# Patient Record
Sex: Male | Born: 1995
Health system: Southern US, Community
[De-identification: ages and names within clinical notes are randomized; demographics above are authoritative.]

## PROBLEM LIST (undated history)

## (undated) DIAGNOSIS — S060X0A Concussion without loss of consciousness, initial encounter: Secondary | ICD-10-CM

## (undated) HISTORY — PX: APPENDECTOMY: SHX54

## (undated) HISTORY — PX: TONSILLECTOMY: SUR1361

## (undated) HISTORY — DX: Concussion without loss of consciousness, initial encounter: S06.0X0A

## (undated) HISTORY — PX: ADENOIDECTOMY: SUR15

---
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
## 2016-04-28 ENCOUNTER — Emergency Department (HOSPITAL_COMMUNITY)
Admission: EM | Admit: 2016-04-28 | Discharge: 2016-04-28 | Disposition: A | Discharge status: Home / Self Care | Payer: No Typology Code available for payment source | Attending: Emergency Medicine | Admitting: Emergency Medicine

## 2016-04-28 ENCOUNTER — Encounter (HOSPITAL_COMMUNITY): Payer: Self-pay

## 2016-04-28 ENCOUNTER — Emergency Department (HOSPITAL_COMMUNITY): Payer: No Typology Code available for payment source

## 2016-04-28 DIAGNOSIS — Y9384 Activity, sleeping: Secondary | ICD-10-CM | POA: Insufficient documentation

## 2016-04-28 DIAGNOSIS — S60414A Abrasion of right ring finger, initial encounter: Secondary | ICD-10-CM | POA: Diagnosis not present

## 2016-04-28 DIAGNOSIS — Y999 Unspecified external cause status: Secondary | ICD-10-CM | POA: Diagnosis not present

## 2016-04-28 DIAGNOSIS — M545 Low back pain, unspecified: Secondary | ICD-10-CM

## 2016-04-28 DIAGNOSIS — S6991XA Unspecified injury of right wrist, hand and finger(s), initial encounter: Secondary | ICD-10-CM | POA: Diagnosis present

## 2016-04-28 DIAGNOSIS — Y92410 Unspecified street and highway as the place of occurrence of the external cause: Secondary | ICD-10-CM | POA: Diagnosis not present

## 2016-04-28 MED ORDER — METHOCARBAMOL 500 MG PO TABS: 500.0000 mg | ORAL_TABLET | Freq: Two times a day (BID) | ORAL | 0 refills | Status: DC | PRN

## 2016-04-28 MED ORDER — NAPROXEN 250 MG PO TABS: 250.0000 mg | ORAL_TABLET | Freq: Two times a day (BID) | ORAL | 0 refills | Status: DC

## 2016-04-28 NOTE — ED Notes (Signed)
Patient transported to X-ray 
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
## 2016-10-22 ENCOUNTER — Encounter: Payer: Self-pay | Admitting: Family Medicine

## 2016-10-22 ENCOUNTER — Ambulatory Visit (INDEPENDENT_AMBULATORY_CARE_PROVIDER_SITE_OTHER): Payer: BLUE CROSS/BLUE SHIELD | Admitting: Family Medicine

## 2016-10-22 VITALS — BP 130/90 | HR 78 | Ht 71.25 in | Wt 187.0 lb

## 2016-10-22 DIAGNOSIS — Z113 Encounter for screening for infections with a predominantly sexual mode of transmission: Secondary | ICD-10-CM | POA: Diagnosis not present

## 2016-10-22 DIAGNOSIS — Z0000 Encounter for general adult medical examination without abnormal findings: Secondary | ICD-10-CM | POA: Diagnosis not present

## 2016-10-22 LAB — COMPREHENSIVE METABOLIC PANEL
ALBUMIN: 4.7 g/dL (ref 3.5–5.2)
ALK PHOS: 58 U/L (ref 39–117)
ALT: 12 U/L (ref 0–53)
AST: 20 U/L (ref 0–37)
BILIRUBIN TOTAL: 0.9 mg/dL (ref 0.2–1.2)
BUN: 17 mg/dL (ref 6–23)
CALCIUM: 9.8 mg/dL (ref 8.4–10.5)
CHLORIDE: 105 meq/L (ref 96–112)
CO2: 30 mEq/L (ref 19–32)
CREATININE: 1.03 mg/dL (ref 0.40–1.50)
GFR: 117.83 mL/min (ref >60.00)
Glucose, Bld: 92 mg/dL (ref 70–99)
Potassium: 3.9 mEq/L (ref 3.5–5.1)
SODIUM: 139 meq/L (ref 135–145)
TOTAL PROTEIN: 7.2 g/dL (ref 6.0–8.3)

## 2016-10-22 LAB — LIPID PANEL
CHOLESTEROL: 110 mg/dL (ref 0–200)
HDL: 36.2 mg/dL — ABNORMAL LOW (ref >39.00)
LDL CALC: 67 mg/dL (ref 0–99)
NonHDL: 73.91
TRIGLYCERIDES: 34 mg/dL (ref 0.0–149.0)
Total CHOL/HDL Ratio: 3
VLDL: 6.8 mg/dL (ref 0.0–40.0)
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
## 2016-10-22 NOTE — Progress Notes (Signed)
Pre visit review using our clinic review tool, if applicable. No additional management support is needed unless otherwise documented below in the visit note. 

## 2016-10-22 NOTE — Assessment & Plan Note (Signed)
Discussed dangers of smoking, alcohol, and drug abuse.  Also discussed sexual activity, pregnancy risk, and STD risk.  Encouraged to get regular exercise and a balanced diet.  Discussed immunizations and they have also been updated in the chart.  Orders Placed This Encounter  Procedures  . GC/Chlamydia Probe Amp  . Comprehensive metabolic panel  . Lipid panel  . HIV antibody (with reflex)  . RPR  . HSV(herpes simplex vrs) 1+2 ab-IgM

## 2016-10-23 LAB — GC/CHLAMYDIA PROBE AMP
CT PROBE, AMP APTIMA: NOT DETECTED
GC PROBE AMP APTIMA: NOT DETECTED

## 2016-10-23 LAB — RPR

## 2016-10-23 LAB — HIV ANTIBODY (ROUTINE TESTING W REFLEX): HIV: NONREACTIVE

## 2016-10-29 LAB — HSV 1/2 AB (IGM), IFA W/RFLX TITER
HSV 1 IgM Screen: NEGATIVE
HSV 2 IgM Screen: NEGATIVE

## 2017-01-14 ENCOUNTER — Telehealth: Payer: Self-pay | Admitting: Family Medicine

## 2017-01-14 ENCOUNTER — Ambulatory Visit: Payer: BLUE CROSS/BLUE SHIELD | Admitting: Family Medicine

## 2017-01-14 NOTE — Telephone Encounter (Signed)
Patient did not come in for their appointment today for personal. Please let me know if patient needs to be contacted immediately for follow up or no follow up needed. Do you want to charge the NSF?

## 2017-01-14 NOTE — Telephone Encounter (Signed)
Yes please contact patient to see why he missed appointment and if he still needs to be seen- please don't charge if there was an emergency.

## 2017-08-10 ENCOUNTER — Other Ambulatory Visit: Payer: Self-pay

## 2017-08-10 ENCOUNTER — Emergency Department
Admission: EM | Admit: 2017-08-10 | Discharge: 2017-08-10 | Disposition: A | Discharge status: Home / Self Care | Payer: BLUE CROSS/BLUE SHIELD | Attending: Student in an Organized Health Care Education/Training Program | Admitting: Student in an Organized Health Care Education/Training Program

## 2017-08-10 ENCOUNTER — Emergency Department: Payer: BLUE CROSS/BLUE SHIELD

## 2017-08-10 DIAGNOSIS — W208XXA Other cause of strike by thrown, projected or falling object, initial encounter: Secondary | ICD-10-CM | POA: Diagnosis not present

## 2017-08-10 DIAGNOSIS — Y939 Activity, unspecified: Secondary | ICD-10-CM | POA: Diagnosis not present

## 2017-08-10 DIAGNOSIS — S90112A Contusion of left great toe without damage to nail, initial encounter: Secondary | ICD-10-CM | POA: Insufficient documentation

## 2017-08-10 DIAGNOSIS — Y999 Unspecified external cause status: Secondary | ICD-10-CM | POA: Insufficient documentation

## 2017-08-10 DIAGNOSIS — S9032XA Contusion of left foot, initial encounter: Secondary | ICD-10-CM | POA: Diagnosis not present

## 2017-08-10 DIAGNOSIS — M79672 Pain in left foot: Secondary | ICD-10-CM | POA: Diagnosis not present

## 2017-08-10 DIAGNOSIS — F17200 Nicotine dependence, unspecified, uncomplicated: Secondary | ICD-10-CM | POA: Diagnosis not present

## 2017-08-10 DIAGNOSIS — S90212A Contusion of left great toe with damage to nail, initial encounter: Secondary | ICD-10-CM | POA: Diagnosis not present

## 2017-08-10 DIAGNOSIS — Y929 Unspecified place or not applicable: Secondary | ICD-10-CM | POA: Insufficient documentation

## 2017-08-10 DIAGNOSIS — S99922A Unspecified injury of left foot, initial encounter: Secondary | ICD-10-CM | POA: Diagnosis not present

## 2017-08-10 MED ORDER — TRAMADOL HCL 50 MG PO TABS
50.0000 mg | ORAL_TABLET | Freq: Once | ORAL | Status: AC
  Administered 2017-08-10: 50 mg via ORAL
  Filled 2017-08-10: qty 1

## 2017-08-10 MED ORDER — TRAMADOL HCL 50 MG PO TABS: 50.0000 mg | ORAL_TABLET | Freq: Four times a day (QID) | ORAL | 0 refills | Status: DC | PRN

## 2017-08-10 MED ORDER — IBUPROFEN 600 MG PO TABS: 600.0000 mg | ORAL_TABLET | Freq: Three times a day (TID) | ORAL | 0 refills | Status: DC | PRN

## 2017-08-10 MED ORDER — NAPROXEN 500 MG PO TABS
500.0000 mg | ORAL_TABLET | Freq: Once | ORAL | Status: AC
  Administered 2017-08-10: 500 mg via ORAL
  Filled 2017-08-10: qty 1

## 2017-08-10 NOTE — ED Provider Notes (Signed)
[facility] Emergency Department Provider Note   ____________________________________________   First MD Initiated Contact with Patient 08/10/17 1800     (approximate)  I have reviewed the triage vital signs and the nursing notes.   HISTORY  Chief Complaint Foot Injury    HPI Todd Mcintyre is a 22 y.o. male patient complaining of left foot pain secondary to dropping a brick on his foot.  Patient presents with swelling and bruising to the left great toe.  Patient able to ambulate with mild discomfort.  Patient rates the pain as a 9/10.  Patient described the pain is "ache".  No pulses measured prior to arrival.  Patient denies loss of sensation to the foot.  Past Medical History:  Diagnosis Date  . Concussion without loss of consciousness 02/06/2013    Patient Active Problem List   Diagnosis Date Noted  . Visit for well man health check 10/22/2016  . Screening examination for STD (sexually transmitted disease) 10/22/2016    Past Surgical History:  Procedure Laterality Date  . ADENOIDECTOMY    . APPENDECTOMY    . LAPAROSCOPIC APPENDECTOMY N/A 06/11/2014   Procedure: APPENDECTOMY LAPAROSCOPIC;  Surgeon: Atilano InaEric M Wilson, MD;  [facility] OR;  Service: General;  Laterality: N/A;  . TONSILLECTOMY      Prior to Admission medications   Medication Sig Start Date End Date Taking? Authorizing Provider  ibuprofen (ADVIL,MOTRIN) 600 MG tablet Take 1 tablet (600 mg total) by mouth every 8 (eight) hours as needed. 08/10/17   Joni ReiningSmith, Ronald K, PA-C  methocarbamol (ROBAXIN) 500 MG tablet Take 1 tablet (500 mg total) by mouth 2 (two) times daily as needed for muscle spasms. Patient not taking: Reported on 10/22/2016 04/28/16   Everlene Farrieransie, William, PA-C  naproxen (NAPROSYN) 250 MG tablet Take 1 tablet (250 mg total) by mouth 2 (two) times daily with a meal. Patient not taking: Reported on 10/22/2016 04/28/16   Everlene Farrieransie, William, PA-C  oxyCODONE-acetaminophen  (PERCOCET/ROXICET) 5-325 MG per tablet Take 1-2 tablets by mouth every 4 (four) hours as needed for moderate pain. Patient not taking: Reported on 10/22/2016 06/12/14   Gaynelle AduWilson, Eric, MD  traMADol (ULTRAM) 50 MG tablet Take 1 tablet (50 mg total) by mouth every 6 (six) hours as needed for moderate pain. 08/10/17   Joni ReiningSmith, Ronald K, PA-C    Allergies Patient has no known allergies.  Family History  Problem Relation Age of Onset  . Diabetes Maternal Grandmother     Social History Social History   Tobacco Use  . Smoking status: Light Tobacco Smoker  . Smokeless tobacco: Never Used  Substance Use Topics  . Alcohol use: Yes  . Drug use: Yes    Types: Marijuana    Comment: pt states uses it "twice a  day everyday"    Review of Systems Constitutional: No fever/chills Eyes: No visual changes. ENT: No sore throat. Cardiovascular: Denies chest pain. Respiratory: Denies shortness of breath. Gastrointestinal: No abdominal pain.  No nausea, no vomiting.  No diarrhea.  No constipation. Genitourinary: Negative for dysuria. Musculoskeletal: Left foot pain  skin: Negative for rash. Neurological: Negative for headaches, focal weakness or numbness.   ____________________________________________   PHYSICAL EXAM:  VITAL SIGNS: ED Triage Vitals [08/10/17 1659]  Enc Vitals Group     BP 135/89     Pulse Rate 89     Resp 18     Temp 98.7 F (37.1 C)     Temp Source Oral     SpO2  99 %     Weight 187 lb (84.8 kg)     Height 6' (1.829 m)     Head Circumference      Peak Flow      Pain Score 9     Pain Loc      Pain Edu?      Excl. in GC?    Constitutional: Alert and oriented. Well appearing and in no acute distress. Cardiovascular: Normal rate, regular rhythm. Grossly normal heart sounds.  Good peripheral circulation. Respiratory: Normal respiratory effort.  No retractions. Lungs CTAB. Musculoskeletal: No obvious deformity to left great toe.  Mild edema with moderate guarding  palpation.  Patient has equal flexion extension of the left great toe. Skin:  Skin is warm, dry and intact. No rash noted.  ____________________________________________   LABS (all labs ordered are listed, but only abnormal results are displayed)  Labs Reviewed - No data to display ____________________________________________  EKG   ____________________________________________  RADIOLOGY  No acute findings x-ray of the left great toe.  Official radiology report(s): Dg Foot Complete Left  Result Date: 08/10/2017 CLINICAL DATA:  Status post trauma to the left foot with pain. EXAM: LEFT FOOT - COMPLETE 3+ VIEW COMPARISON:  None. FINDINGS: There is no evidence of fracture or dislocation. There is no evidence of arthropathy or other focal bone abnormality. Soft tissues are unremarkable. IMPRESSION: Negative. Electronically Signed   By: Sherian Rein M.D.   On: 08/10/2017 17:26    ____________________________________________   PROCEDURES  Procedure(s) performed: None  Procedures  Critical Care performed: No  ____________________________________________   INITIAL IMPRESSION / ASSESSMENT AND PLAN / ED COURSE  As part of my medical decision making, I reviewed the following data within the electronic MEDICAL RECORD NUMBER    Patient presents with left great toe pain secondary to contusion.  Discussed negative x-ray findings with patient.  Patient given discharge care instructions.  Patient given a work note and advised take medication as needed.  Patient advised to follow-up with PCP if no improvement in 3-5 days.      ____________________________________________   FINAL CLINICAL IMPRESSION(S) / ED DIAGNOSES  Final diagnoses:  Contusion of left great toe without damage to nail, initial encounter  Contusion of left foot, initial encounter     ED Discharge Orders        Ordered    ibuprofen (ADVIL,MOTRIN) 600 MG tablet  Every 8 hours PRN     08/10/17 1808    traMADol  (ULTRAM) 50 MG tablet  Every 6 hours PRN     08/10/17 1808       Note:  This document was prepared using Dragon voice recognition software and may include unintentional dictation errors.    Joni Reining, PA-C 08/10/17 1827    Willy Eddy, MD 08/10/17 Silva Bandy

## 2017-08-10 NOTE — Discharge Instructions (Signed)
Wear open shoe for 1-2 days as needed.  Follow discharge care instruction take medication as directed.

## 2017-08-10 NOTE — ED Triage Notes (Signed)
Pt arrives to ED (not workers comp). Dropped 22lb bricks on L foot. Swelling and bruising noted to L big toe. Bruising at nail bed. Pt states nail was already half a nail d/t basketball injury. Pt able to walk on foot. Alert, oriented.

## 2018-03-15 DIAGNOSIS — Z202 Contact with and (suspected) exposure to infections with a predominantly sexual mode of transmission: Secondary | ICD-10-CM | POA: Diagnosis not present

## 2018-10-08 IMAGING — DX DG FOOT COMPLETE 3+V*L*
3 series · 3 of 3 positions shown · non-contrast
Comparison: None.

CLINICAL DATA: Status post trauma to the left foot with pain.

EXAM:
LEFT FOOT - COMPLETE 3+ VIEW

[foot ap]
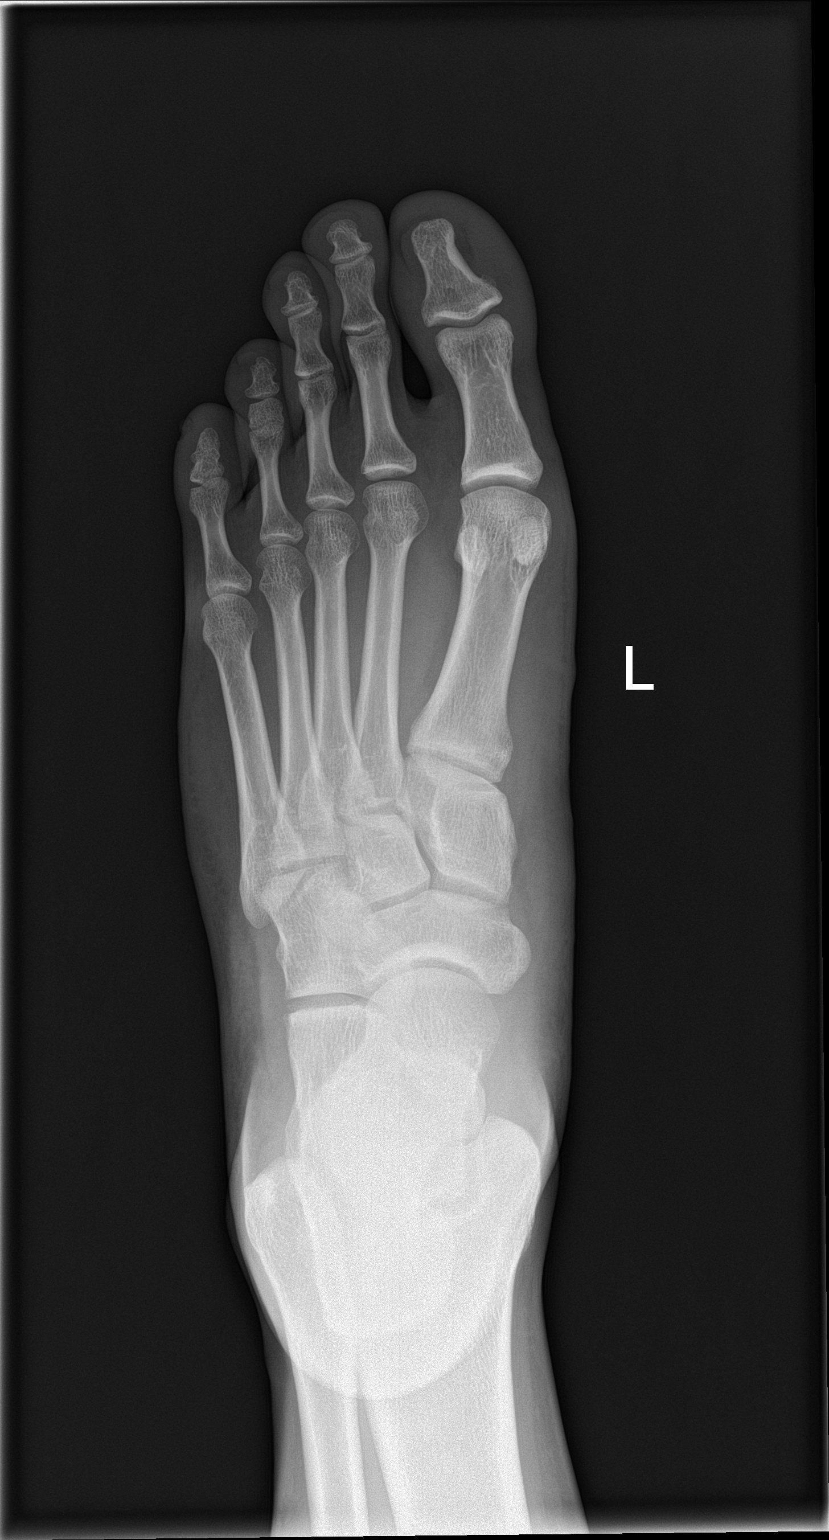

[foot obl]
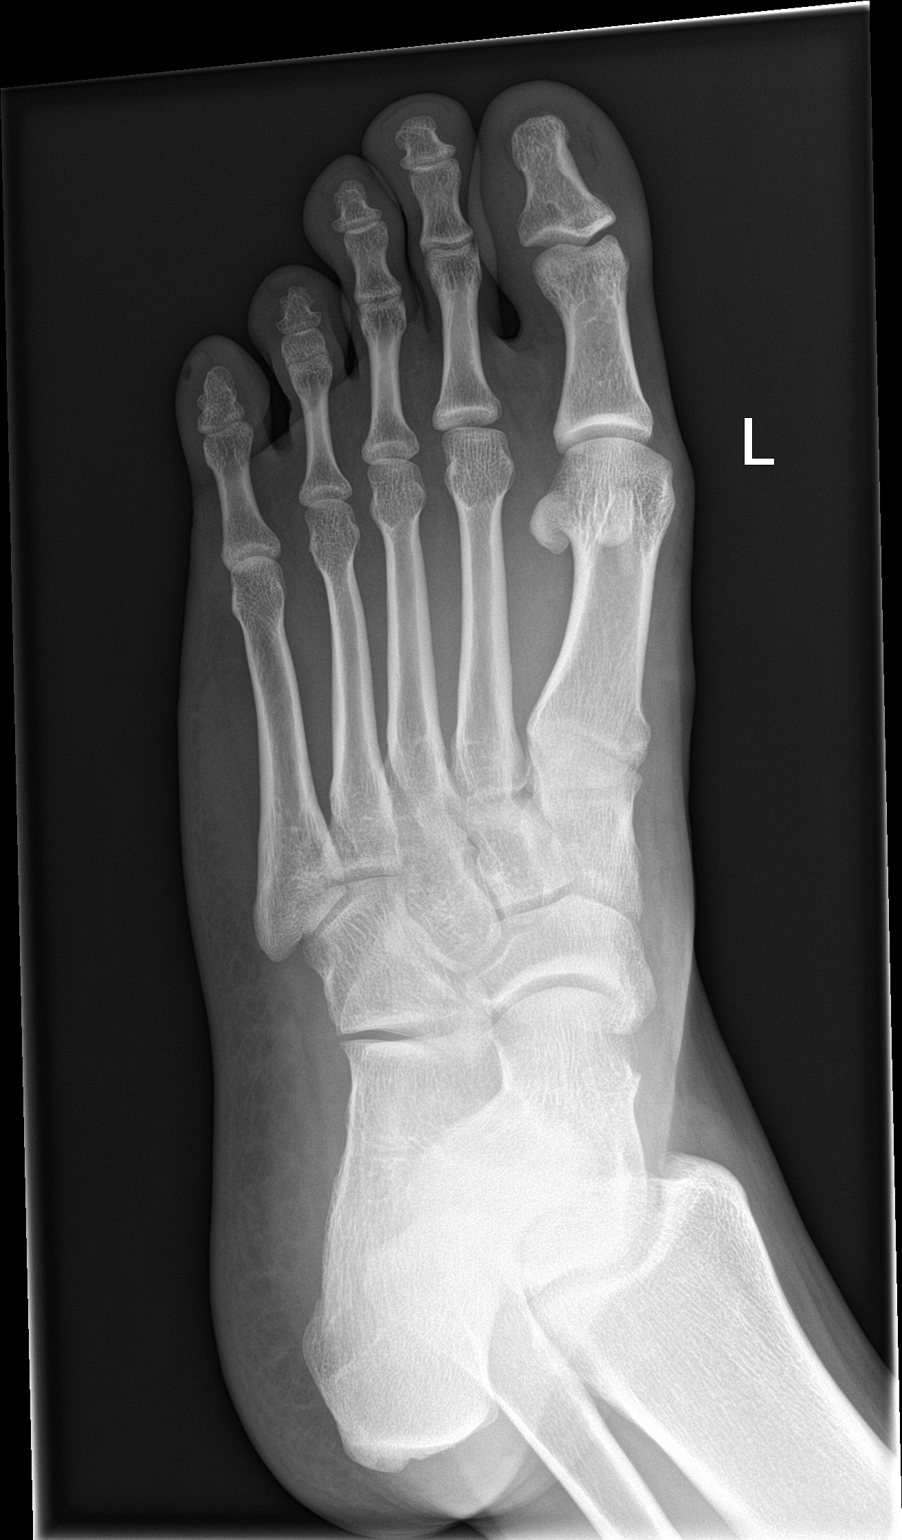

[foot lat]
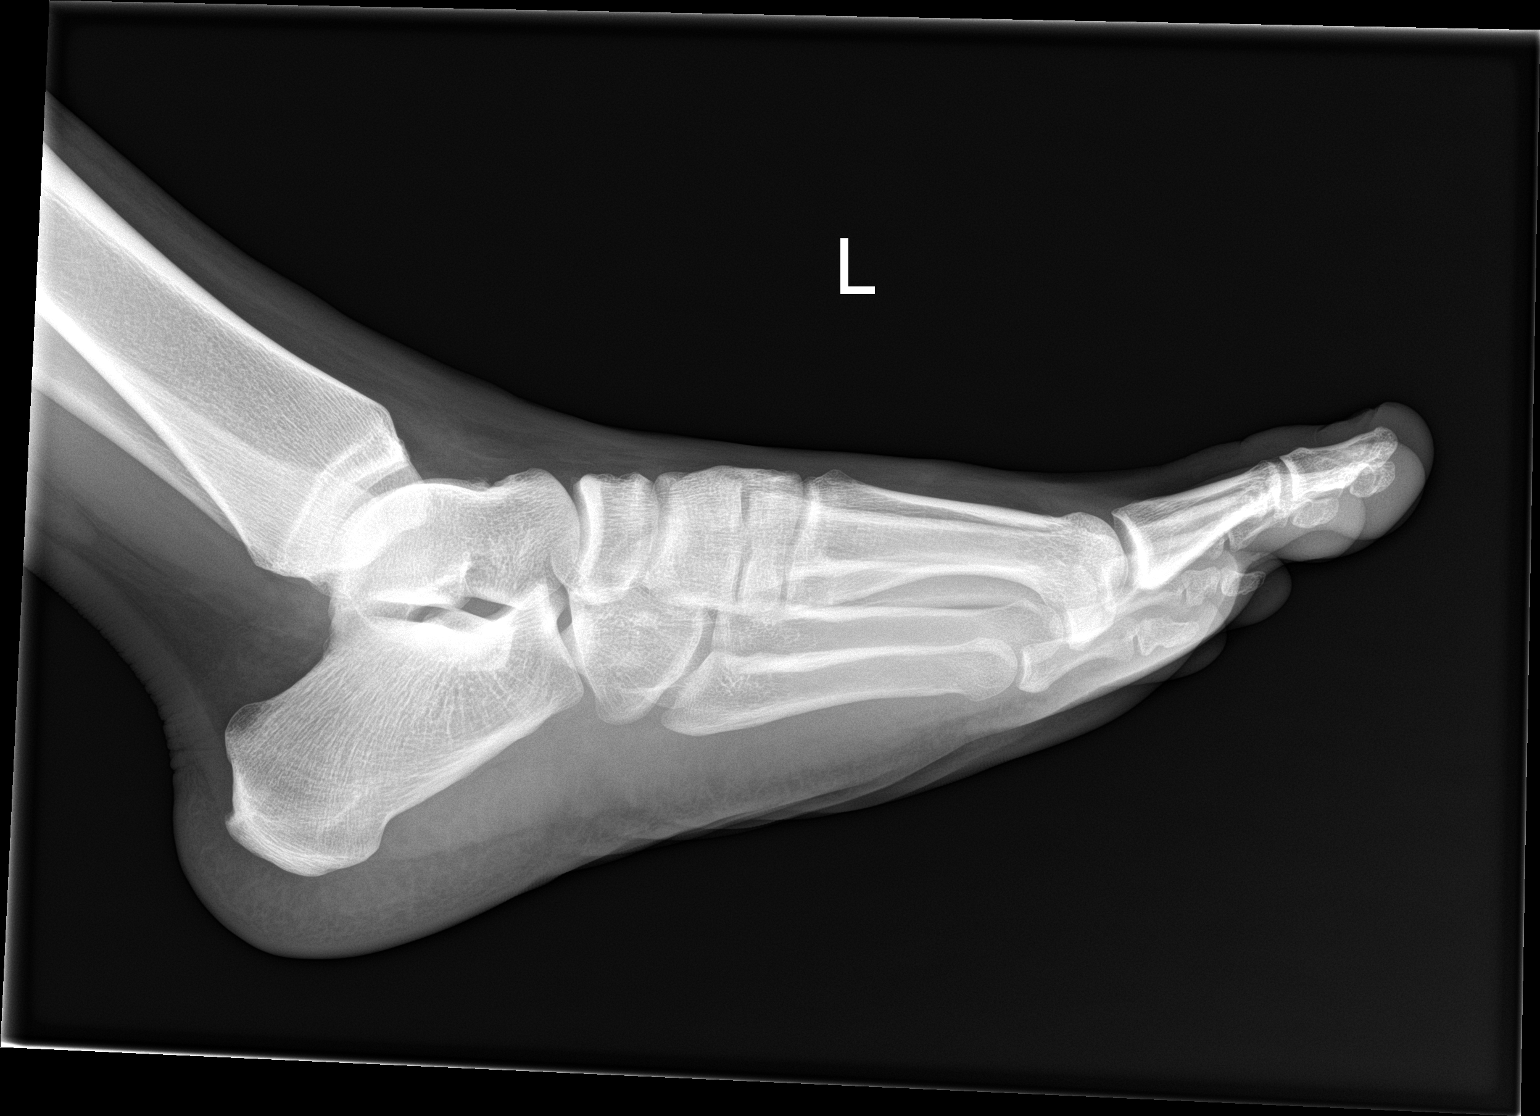

[3 of 3 positions shown; findings below may reference images not displayed]

FINDINGS: There is no evidence of fracture or dislocation. There is no
evidence of arthropathy or other focal bone abnormality. Soft
tissues are unremarkable.
IMPRESSION: Negative.

## 2019-05-15 ENCOUNTER — Other Ambulatory Visit: Payer: BLUE CROSS/BLUE SHIELD

## 2019-05-23 ENCOUNTER — Other Ambulatory Visit: Payer: BC Managed Care – PPO

## 2019-05-24 ENCOUNTER — Ambulatory Visit: Payer: BC Managed Care – PPO | Attending: Internal Medicine

## 2019-05-24 DIAGNOSIS — Z20822 Contact with and (suspected) exposure to covid-19: Secondary | ICD-10-CM

## 2019-05-25 LAB — NOVEL CORONAVIRUS, NAA: SARS-CoV-2, NAA: NOT DETECTED

## 2019-06-16 ENCOUNTER — Other Ambulatory Visit: Payer: BC Managed Care – PPO

## 2019-06-19 ENCOUNTER — Ambulatory Visit: Payer: BC Managed Care – PPO | Attending: Internal Medicine

## 2019-06-19 DIAGNOSIS — Z20822 Contact with and (suspected) exposure to covid-19: Secondary | ICD-10-CM

## 2019-06-20 LAB — NOVEL CORONAVIRUS, NAA: SARS-CoV-2, NAA: NOT DETECTED

## 2019-11-15 ENCOUNTER — Telehealth: Payer: Self-pay | Admitting: General Practice

## 2019-11-15 NOTE — Telephone Encounter (Signed)
Removed Dr. Dayton Martes as patient's PCP due to her leaving this facility and patient has not been seen in 3 years.

## 2019-12-04 ENCOUNTER — Ambulatory Visit: Payer: BC Managed Care – PPO | Attending: Internal Medicine

## 2019-12-04 ENCOUNTER — Other Ambulatory Visit: Payer: BC Managed Care – PPO

## 2019-12-04 DIAGNOSIS — Z20822 Contact with and (suspected) exposure to covid-19: Secondary | ICD-10-CM

## 2019-12-05 LAB — SARS-COV-2, NAA 2 DAY TAT

## 2019-12-05 LAB — NOVEL CORONAVIRUS, NAA: SARS-CoV-2, NAA: NOT DETECTED

## 2023-02-20 ENCOUNTER — Emergency Department (HOSPITAL_COMMUNITY)
Admission: EM | Admit: 2023-02-20 | Discharge: 2023-02-20 | Disposition: A | Discharge status: Home / Self Care | Payer: Medicaid Other | Attending: Emergency Medicine | Admitting: Emergency Medicine

## 2023-02-20 ENCOUNTER — Emergency Department (HOSPITAL_COMMUNITY): Payer: Medicaid Other

## 2023-02-20 ENCOUNTER — Other Ambulatory Visit: Payer: Self-pay

## 2023-02-20 DIAGNOSIS — Q0701 Arnold-Chiari syndrome with spina bifida: Secondary | ICD-10-CM | POA: Insufficient documentation

## 2023-02-20 DIAGNOSIS — H532 Diplopia: Secondary | ICD-10-CM | POA: Diagnosis present

## 2023-02-20 LAB — CBC
HCT: 46.6 % (ref 39.0–52.0)
Hemoglobin: 14.6 g/dL (ref 13.0–17.0)
MCH: 27.8 pg (ref 26.0–34.0)
MCHC: 31.3 g/dL (ref 30.0–36.0)
MCV: 88.8 fL (ref 80.0–100.0)
Platelets: 194 10*3/uL (ref 150–400)
RBC: 5.25 MIL/uL (ref 4.22–5.81)
RDW: 11.9 % (ref 11.5–15.5)
WBC: 6 10*3/uL (ref 4.0–10.5)
nRBC: 0 % (ref 0.0–0.2)

## 2023-02-20 LAB — BASIC METABOLIC PANEL
Anion gap: 9 (ref 5–15)
BUN: 13 mg/dL (ref 6–20)
CO2: 26 mmol/L (ref 22–32)
Calcium: 9.4 mg/dL (ref 8.9–10.3)
Chloride: 102 mmol/L (ref 98–111)
Creatinine, Ser: 0.76 mg/dL (ref 0.61–1.24)
GFR, Estimated: >60 mL/min (ref >60)
Glucose, Bld: 97 mg/dL (ref 70–99)
Potassium: 4 mmol/L (ref 3.5–5.1)
Sodium: 137 mmol/L (ref 135–145)

## 2023-02-20 NOTE — ED Notes (Signed)
Pt to MR

## 2023-02-20 NOTE — ED Triage Notes (Signed)
Pt arrived via POV. C/o double vision that began at 7p yesterday. Vision is normal when looking through each eye independently.  No HA

## 2023-02-20 NOTE — ED Provider Notes (Signed)
[place] EMERGENCY DEPARTMENT AT [facility] Provider Note   CSN: [id number] Arrival date & time: 02/20/23  1108     History  Chief Complaint  Patient presents with   Diplopia    Todd Mcintyre is a 27 y.o. male with overall noncontributory past medical history presents concern for double vision, blurry vision, eye pain since 7 PM yesterday.  He reports vision is overall normal when looking through each eye independently but he has difficulty looking to the right, reports pain, pressure behind eyes when attempting to focus.  He reports questionable history of lazy eye once or twice in the past, but overall has never had this problem before.  No recent head injury.  He denies any numbness, tingling, curtain over vision, weakness bilateral upper and lower extremities.  HPI     Home Medications Prior to Admission medications   Medication Sig Start Date End Date Taking? Authorizing Provider  ibuprofen (ADVIL,MOTRIN) 600 MG tablet Take 1 tablet (600 mg total) by mouth every 8 (eight) hours as needed. 08/10/17   Joni Reining, PA-C  methocarbamol (ROBAXIN) 500 MG tablet Take 1 tablet (500 mg total) by mouth 2 (two) times daily as needed for muscle spasms. Patient not taking: Reported on 10/22/2016 04/28/16   Everlene Farrier, PA-C  naproxen (NAPROSYN) 250 MG tablet Take 1 tablet (250 mg total) by mouth 2 (two) times daily with a meal. Patient not taking: Reported on 10/22/2016 04/28/16   Everlene Farrier, PA-C  oxyCODONE-acetaminophen (PERCOCET/ROXICET) 5-325 MG per tablet Take 1-2 tablets by mouth every 4 (four) hours as needed for moderate pain. Patient not taking: Reported on 10/22/2016 06/12/14   Gaynelle Adu, MD  traMADol (ULTRAM) 50 MG tablet Take 1 tablet (50 mg total) by mouth every 6 (six) hours as needed for moderate pain. 08/10/17   Joni Reining, PA-C      Allergies    Patient has no known allergies.    Review of Systems   Review of Systems  All other systems  reviewed and are negative.   Physical Exam Updated Vital Signs BP (!) 166/114 (BP Location: Right Arm)   Pulse 78   Temp 98 F (36.7 C)   Ht 6' (1.829 m)   Wt 99.8 kg   SpO2 99%   BMI 29.84 kg/m  Physical Exam Vitals and nursing note reviewed.  Constitutional:      General: He is not in acute distress.    Appearance: Normal appearance.  HENT:     Head: Normocephalic and atraumatic.  Eyes:     General:        Right eye: No discharge.        Left eye: No discharge.     Comments: Patient with notable strabismus, as well as a right sided CN VI palsy, endorses double vision with binocular vision but no double vision with eye covered.  Cardiovascular:     Rate and Rhythm: Normal rate and regular rhythm.     Heart sounds: No murmur heard.    No friction rub. No gallop.  Pulmonary:     Effort: Pulmonary effort is normal.     Breath sounds: Normal breath sounds.  Abdominal:     General: Bowel sounds are normal.     Palpations: Abdomen is soft.  Skin:    General: Skin is warm and dry.     Capillary Refill: Capillary refill takes less than 2 seconds.  Neurological:     Mental Status: He is  alert and oriented to person, place, and time.     Comments: Cranial nerves VII through XII grossly intact.  Intact finger-nose, intact heel-to-shin.  Romberg negative, gait normal.  Alert and oriented x3.  Moves all 4 limbs spontaneously, normal coordination.  No pronator drift.  Intact strength 5 out of 5 bilateral upper and lower extremities.    Psychiatric:        Mood and Affect: Mood normal.        Behavior: Behavior normal.     ED Results / Procedures / Treatments   Labs (all labs ordered are listed, but only abnormal results are displayed) Labs Reviewed  CBC  BASIC METABOLIC PANEL    EKG None  Radiology MR BRAIN WO CONTRAST  Result Date: 02/20/2023 CLINICAL DATA:  Neuro deficit, acute, stroke suspected EXAM: MRI HEAD WITHOUT CONTRAST TECHNIQUE: Multiplanar, multiecho  pulse sequences of the brain and surrounding structures were obtained without intravenous contrast. COMPARISON:  03/18/2014 FINDINGS: Brain: Diffusion imaging does not show any acute or subacute infarction. Patient has Chiari malformation with cerebellar tonsillar extension through the foramen magnum of up to 9 mm. There is a prominent cervical cord syrinx presumably secondary to that. No focal abnormality otherwise affects the cerebellum. Cerebral hemispheres are normal. No stroke, mass, hemorrhage, hydrocephalus or extra-axial collection. Vascular: Major vessels at the base of the brain show flow. Skull and upper cervical spine: Negative Sinuses/Orbits: Clear/normal Other: None IMPRESSION: No acute brain finding. Chiari malformation with cerebellar tonsillar extension through the foramen magnum of up to 9 mm. Prominent cervical cord syrinx presumably secondary to that. Electronically Signed   By: Paulina Fusi M.D.   On: 02/20/2023 14:21    Procedures Procedures    Medications Ordered in ED Medications - No data to display  ED Course/ Medical Decision Making/ A&P Clinical Course as of 02/20/23 1508  Sat Feb 20, 2023  1220 Double vision around 7 last night. Was smoking weed, playing video games. Endorses vertical diplopia. Normal visual acuity. Not different with distance. Worse with right gaze and central gaze, improved with leftward gaze. Endorses mild eye pain "deep, behind my eyes" [CP]    Clinical Course User Index [CP] Shaquana Buel, Harrel Carina, PA-C                                 Medical Decision Making Amount and/or Complexity of Data Reviewed Labs: ordered. Radiology: ordered.   This patient is a 27 y.o. male  who presents to the ED for concern of vision disturbance, double vision, eye pain, focus issue.   Differential diagnoses prior to evaluation: The emergent differential diagnosis includes, but is not limited to,  Brain mass, stroke, strabismus, myasthenia gravis, versus iritis  or retinal detachment, versus other . This is not an exhaustive differential.   Past Medical History / Co-morbidities / Social History: Overall noncontributory  Physical Exam: Physical exam performed. The pertinent findings include: Patient with notable strabismus, as well as a right sided CN IV palsy, endorses double vision with binocular vision but no double vision with eye covered.   Cranial nerves VII through XII grossly intact.  Intact finger-nose, intact heel-to-shin.  Romberg negative, gait normal.  Alert and oriented x3.  Moves all 4 limbs spontaneously, normal coordination.  No pronator drift.  Intact strength 5 out of 5 bilateral upper and lower extremities.  Patient somewhat hypertensive in the emergency department, blood pressure 166/114  Lab Tests/Imaging studies:  I personally interpreted labs/imaging and the pertinent results include: CBC unremarkable, BMP overall unremarkable, independently interpreted MR brain without contrast, shows Chiari malformation, with cerebellar tonsillar extension 9 mm into foramen magnum, associated cervical syrinx, no mass noted at pituitary gland/optic chiasm, no new stroke noted. I agree with the radiologist interpretation.  Consults: Spoke with the ophthalmologist, Dr. Vonna Kotyk, and after discussion of his physical exam findings, MRI evaluation we will arrange for close outpatient follow-up.  Dr. Vonna Kotyk suggests patient report to the ophthalmologist for mapping of his suspected CN IV palsy.  Additionally given follow-up information about his Chiari malformation.  Patient understands agrees to plan to follow-up urgently with ophthalmology.   Disposition: After consideration of the diagnostic results and the patients response to treatment, I feel that patient is stable for discharge, but will need close ophthalmology follow-up.   emergency department workup does not suggest an emergent condition requiring admission or immediate intervention beyond what  has been performed at this time. The plan is: as above. The patient is safe for discharge and has been instructed to return immediately for worsening symptoms, change in symptoms or any other concerns.  Final Clinical Impression(s) / ED Diagnoses Final diagnoses:  Diplopia  Chiari malformation type II [facility])    Rx / DC Orders ED Discharge Orders     None         Olene Floss, PA-C 02/20/23 1508    Anders Simmonds T, DO 02/22/23 [phone]

## 2023-02-20 NOTE — ED Provider Notes (Signed)
Signout received on this 27 year old male who presented with diplopia since 7 PM last night.  See previous note for full details.  Patient waiting conversation with neurology prior to discharge.  Otherwise he has plans to go directly to ophthalmology office after leaving the emergency room. Physical Exam  BP 134/86 (BP Location: Right Arm)   Pulse 78   Temp 98 F (36.7 C)   Resp 16   Ht 6' (1.829 m)   Wt 99.8 kg   SpO2 99%   BMI 29.84 kg/m     Procedures  Procedures  ED Course / MDM   Clinical Course as of 02/20/23 1539  Sat Feb 20, 2023  1220 Double vision around 7 last night. Was smoking weed, playing video games. Endorses vertical diplopia. Normal visual acuity. Not different with distance. Worse with right gaze and central gaze, improved with leftward gaze. Endorses mild eye pain "deep, behind my eyes" [CP]    Clinical Course User Index [CP] Prosperi, Harrel Carina, PA-C   Medical Decision Making Amount and/or Complexity of Data Reviewed Labs: ordered. Radiology: ordered.   Discussed with on-call neurologist Dr. Viviann Spare  who does not recommend LP or other workup in the emergency department.  Feels it is appropriate for patient to follow-up with ophthalmology for additional outpatient workup.       Marita Kansas, PA-C 02/20/23 1541    Horton, Clabe Seal, DO 02/21/23 1528

## 2023-02-20 NOTE — Discharge Instructions (Addendum)
Please go straight to the eye doctor for mapping of what we suspect is a cranial nerve palsy causing your diplopia.  Additionally you have a condition called a Chiari malformation.  I have attached some information so you can look up this condition, you should inform your future doctors about this, there is nothing you need to do in the short-term about this, it is likely been present since birth, but can put you at higher risk damage from certain types of head injuries.

## 2023-03-09 ENCOUNTER — Other Ambulatory Visit: Payer: Self-pay

## 2023-03-09 ENCOUNTER — Emergency Department (HOSPITAL_COMMUNITY): Payer: Medicaid Other

## 2023-03-09 ENCOUNTER — Emergency Department (HOSPITAL_COMMUNITY)
Admission: EM | Admit: 2023-03-09 | Discharge: 2023-03-10 | Disposition: A | Discharge status: Home / Self Care | Payer: Medicaid Other | Attending: Emergency Medicine | Admitting: Emergency Medicine

## 2023-03-09 DIAGNOSIS — G950 Syringomyelia and syringobulbia: Secondary | ICD-10-CM

## 2023-03-09 DIAGNOSIS — R200 Anesthesia of skin: Secondary | ICD-10-CM | POA: Insufficient documentation

## 2023-03-09 DIAGNOSIS — G709 Myoneural disorder, unspecified: Secondary | ICD-10-CM

## 2023-03-09 HISTORY — DX: Myoneural disorder, unspecified: G70.9

## 2023-03-09 HISTORY — DX: Syringomyelia and syringobulbia: G95.0

## 2023-03-09 LAB — CBC
HCT: 44.3 % (ref 39.0–52.0)
Hemoglobin: 14.1 g/dL (ref 13.0–17.0)
MCH: 28.3 pg (ref 26.0–34.0)
MCHC: 31.8 g/dL (ref 30.0–36.0)
MCV: 89 fL (ref 80.0–100.0)
Platelets: 209 10*3/uL (ref 150–400)
RBC: 4.98 MIL/uL (ref 4.22–5.81)
RDW: 11.7 % (ref 11.5–15.5)
WBC: 7.2 10*3/uL (ref 4.0–10.5)
nRBC: 0 % (ref 0.0–0.2)

## 2023-03-09 LAB — COMPREHENSIVE METABOLIC PANEL
ALT: 22 U/L (ref 0–44)
AST: 23 U/L (ref 15–41)
Albumin: 4 g/dL (ref 3.5–5.0)
Alkaline Phosphatase: 52 U/L (ref 38–126)
Anion gap: 10 (ref 5–15)
BUN: 14 mg/dL (ref 6–20)
CO2: 26 mmol/L (ref 22–32)
Calcium: 9.7 mg/dL (ref 8.9–10.3)
Chloride: 103 mmol/L (ref 98–111)
Creatinine, Ser: 0.97 mg/dL (ref 0.61–1.24)
GFR, Estimated: >60 mL/min (ref >60)
Glucose, Bld: 104 mg/dL — ABNORMAL HIGH (ref 70–99)
Potassium: 4.2 mmol/L (ref 3.5–5.1)
Sodium: 139 mmol/L (ref 135–145)
Total Bilirubin: 0.5 mg/dL (ref 0.3–1.2)
Total Protein: 7 g/dL (ref 6.5–8.1)

## 2023-03-09 LAB — DIFFERENTIAL
Abs Immature Granulocytes: 0.02 10*3/uL (ref 0.00–0.07)
Basophils Absolute: 0 10*3/uL (ref 0.0–0.1)
Basophils Relative: 0 %
Eosinophils Absolute: 0.2 10*3/uL (ref 0.0–0.5)
Eosinophils Relative: 3 %
Immature Granulocytes: 0 %
Lymphocytes Relative: 25 %
Lymphs Abs: 1.8 10*3/uL (ref 0.7–4.0)
Monocytes Absolute: 0.6 10*3/uL (ref 0.1–1.0)
Monocytes Relative: 8 %
Neutro Abs: 4.6 10*3/uL (ref 1.7–7.7)
Neutrophils Relative %: 64 %

## 2023-03-09 LAB — PROTIME-INR
INR: 0.9 (ref 0.8–1.2)
Prothrombin Time: 12.7 s (ref 11.4–15.2)

## 2023-03-09 LAB — ETHANOL: Alcohol, Ethyl (B): <10 mg/dL (ref <10)

## 2023-03-09 LAB — APTT: aPTT: 29 s (ref 24–36)

## 2023-03-09 NOTE — ED Provider Notes (Signed)
[place] [facility] AT [facility] Provider Note   CSN: [id number] Arrival date & time: 03/09/23  1608     History  Chief Complaint  Patient presents with   Numbness    Todd Mcintyre is a 27 y.o. male with history of Chiari malformation who presents the emergency department complaining of numbness.  Patient was seen in the ER 9/28 for sudden onset diplopia, was diagnosed with cranial nerve IV palsy.  He was discharged with ophthalmology follow-up.  Patient states that about a day or so later he developed lower facial numbness on both sides of his face.  He does feel that he has some difficulty chewing and has been drooling more so on the left side due to the numbness.  About 4 days ago he woke up with left ventral forearm numbness and left hand numbness.  Since then he has been feeling like it is also harder to walk.  He was having some balance issues due to his eye, but even with this covered he feels that he has to pick up his left foot differently than his right.  He does not have any numbness in the legs. Pt saw his ophthalmologist today who sent him to the ER for evaluation of new neurologic deficits.   HPI     Home Medications Prior to Admission medications   Medication Sig Start Date End Date Taking? Authorizing Provider  ibuprofen (ADVIL,MOTRIN) 600 MG tablet Take 1 tablet (600 mg total) by mouth every 8 (eight) hours as needed. 08/10/17   Joni Reining, PA-C  methocarbamol (ROBAXIN) 500 MG tablet Take 1 tablet (500 mg total) by mouth 2 (two) times daily as needed for muscle spasms. Patient not taking: Reported on 10/22/2016 04/28/16   Everlene Farrier, PA-C  naproxen (NAPROSYN) 250 MG tablet Take 1 tablet (250 mg total) by mouth 2 (two) times daily with a meal. Patient not taking: Reported on 10/22/2016 04/28/16   Everlene Farrier, PA-C  oxyCODONE-acetaminophen (PERCOCET/ROXICET) 5-325 MG per tablet Take 1-2 tablets by mouth every 4 (four) hours as  needed for moderate pain. Patient not taking: Reported on 10/22/2016 06/12/14   Gaynelle Adu, MD  traMADol (ULTRAM) 50 MG tablet Take 1 tablet (50 mg total) by mouth every 6 (six) hours as needed for moderate pain. 08/10/17   Joni Reining, PA-C      Allergies    Patient has no known allergies.    Review of Systems   Review of Systems  Neurological:  Positive for numbness.  All other systems reviewed and are negative.   Physical Exam Updated Vital Signs BP 131/83   Pulse 76   Temp 98.3 F (36.8 C)   Resp 16   Ht 6' (1.829 m)   Wt 101.6 kg   SpO2 100%   BMI 30.38 kg/m  Physical Exam Vitals and nursing note reviewed.  Constitutional:      Appearance: Normal appearance.  HENT:     Head: Normocephalic and atraumatic.  Eyes:     Extraocular Movements:     Right eye: Abnormal extraocular motion present.     Conjunctiva/sclera: Conjunctivae normal.  Cardiovascular:     Rate and Rhythm: Normal rate and regular rhythm.  Pulmonary:     Effort: Pulmonary effort is normal. No respiratory distress.     Breath sounds: Normal breath sounds.  Abdominal:     General: There is no distension.     Palpations: Abdomen is soft.     Tenderness:  There is no abdominal tenderness.  Skin:    General: Skin is warm and dry.  Neurological:     Mental Status: He is alert.     Gait: Gait abnormal.     Comments: Cranial nerve IV palsy on the right Facial numbness from the bilateral cheeks down.  No facial droop or slurred speech. Left ventral arm and L total hand numbness 5/5 grip strength, bicep flexion/extension, shoulder extension 5/5 knee flexion/extension L partial foot drop with ambulating     ED Results / Procedures / Treatments   Labs (all labs ordered are listed, but only abnormal results are displayed) Labs Reviewed  COMPREHENSIVE METABOLIC PANEL - Abnormal; Notable for the following components:      Result Value   Glucose, Bld 104 (*)    All other components within normal  limits  PROTIME-INR  APTT  CBC  DIFFERENTIAL  ETHANOL    EKG None  Radiology MR Cervical Spine W and Wo Contrast  Result Date: 03/10/2023 CLINICAL DATA:  History of Chiari malformation and syrinx, new left arm and hand numbness EXAM: MRI CERVICAL SPINE WITHOUT AND WITH CONTRAST TECHNIQUE: Multiplanar and multiecho pulse sequences of the cervical spine, to include the craniocervical junction and cervicothoracic junction, were obtained without and with intravenous contrast. CONTRAST:  7mL GADAVIST GADOBUTROL 1 MMOL/ML IV SOLN, 7mL GADAVIST GADOBUTROL 1 MMOL/ML IV SOLN COMPARISON:  None Available. FINDINGS: Evaluation is limited by motion. Alignment: Straightening of the normal cervical lordosis. No listhesis. Vertebrae: No acute fracture, evidence of discitis, or suspicious osseous lesion. No abnormal enhancement. Cord: Extensive spinal cord syrinx extending from the brainstem through the cervical and imaged thoracic spinal cord. This measures up to 8 x 16 mm (AP by TR). Posterior Fossa, vertebral arteries, paraspinal tissues: Extension of the cerebellar tonsils through the foramen magnum is better evaluated on the same day MRI head. Normal vertebral artery flow voids. No acute finding in the neck. Disc levels: C2-C3: No significant disc bulge. No spinal canal stenosis or neuroforaminal narrowing. C3-C4: No significant disc bulge. Uncovertebral hypertrophy. No spinal canal stenosis. Mild-to-moderate left neural foraminal narrowing. C4-C5: No significant disc bulge. No spinal canal stenosis or neuroforaminal narrowing. C5-C6: No significant disc bulge. No spinal canal stenosis or neuroforaminal narrowing. C6-C7: No significant disc bulge. No spinal canal stenosis or neuroforaminal narrowing. C7-T1: No significant disc bulge. No spinal canal stenosis or neuroforaminal narrowing. IMPRESSION: 1. Extensive spinal cord syrinx extending from the brainstem through the cervical and imaged thoracic spinal cord,  measuring up to 8 x 16 mm in the axial plane. 2. Extension of the cerebellar tonsils through the foramen magnum is better evaluated on the same day MRI head. 3. C3-C4 mild-to-moderate left neural foraminal narrowing. No spinal canal stenosis. Electronically Signed   By: Wiliam Ke M.D.   On: 03/10/2023 02:59   MR Brain W and Wo Contrast  Result Date: 03/10/2023 CLINICAL DATA:  History of Chiari malformation, new left arm/hand numbness 4 days EXAM: MRI HEAD WITHOUT AND WITH CONTRAST TECHNIQUE: Multiplanar, multiecho pulse sequences of the brain and surrounding structures were obtained without and with intravenous contrast. CONTRAST:  7mL GADAVIST GADOBUTROL 1 MMOL/ML IV SOLN COMPARISON:  02/20/2023 FINDINGS: Evaluation is limited by motion. Brain: No restricted diffusion to suggest acute or subacute infarct. No abnormal parenchymal or meningeal enhancement. Redemonstrated Chiari malformation with 9 mm of cerebellar tonsillar extension past the foramen magnum. The associated cervical spinal cord syrinx is best seen on the coronal T2 sequence. No acute hemorrhage, mass,  mass effect, or midline shift. No hydrocephalus or extra-axial collection. Normal pituitary and craniocervical junction. No hemosiderin deposition to suggest remote hemorrhage. Normal cerebral volume for age. Vascular: Grossly normal arterial flow voids. Skull and upper cervical spine: Normal marrow signal. Sinuses/Orbits: Clear paranasal sinuses. No acute finding in the orbits. Other: The mastoid air cells are well aerated. IMPRESSION: 1. No acute intracranial process. No evidence of acute or subacute infarct. 2. Redemonstrated Chiari malformation with 9 mm of cerebellar tonsillar extension past the foramen magnum, with associated cervical spinal cord syrinx. Electronically Signed   By: Wiliam Ke M.D.   On: 03/10/2023 02:50   CT HEAD WO CONTRAST  Result Date: 03/09/2023 CLINICAL DATA:  Double vision and numbness. EXAM: CT HEAD WITHOUT  CONTRAST TECHNIQUE: Contiguous axial images were obtained from the base of the skull through the vertex without intravenous contrast. RADIATION DOSE REDUCTION: This exam was performed according to the departmental dose-optimization program which includes automated exposure control, adjustment of the mA and/or kV according to patient size and/or use of iterative reconstruction technique. COMPARISON:  Brain MRI 02/20/2023 FINDINGS: Brain: No evidence of acute infarction, hemorrhage, hydrocephalus, extra-axial collection or mass lesion/mass effect. There is a low-density in the right dorsal pons not seen on prior brain MRI and on sagittal reformat image 6 has a typical ring like artifact appearance. Chiari malformation with syrinx in the upper cervical cord without change, reference prior MRI measurements. Vascular: No hyperdense vessel or unexpected calcification. Skull: Normal. Negative for fracture or focal lesion. Sinuses/Orbits: No acute finding. IMPRESSION: 1. No acute finding. 2. Known Chiari malformation and cervical cord syrinx. Electronically Signed   By: Tiburcio Pea M.D.   On: 03/09/2023 20:16    Procedures Procedures    Medications Ordered in ED Medications  LORazepam (ATIVAN) injection 0.5 mg (has no administration in time range)  gadobutrol (GADAVIST) 1 MMOL/ML injection 7 mL (7 mLs Intravenous Contrast Given 03/10/23 0241)  gadobutrol (GADAVIST) 1 MMOL/ML injection 7 mL (7 mLs Intravenous Contrast Given 03/10/23 0245)    ED Course/ Medical Decision Making/ A&P                                 Medical Decision Making Amount and/or Complexity of Data Reviewed Labs: ordered. Radiology: ordered.  Risk Prescription drug management.   This patient is a 27 y.o. male  who presents to the ED for concern of facial numbness, L arm numbness.   Differential diagnoses prior to evaluation: The emergent differential diagnosis includes, but is not limited to,  CVA, spinal cord injury,  cranial nerve palsy. This is not an exhaustive differential.   Past Medical History / Co-morbidities / Social History: Chiari malformation  Additional history: Chart reviewed. Pertinent results include: Reviewed ED records from 9/28 including MRI brain.   Physical Exam: Physical exam performed. The pertinent findings include: Mildly hypertensive. Abnormal gait with left foot drop. Left ventral forearm and L hand numbness. Normal strength. Persistent abnormal EOMI of R eye due to CN IV palsy.   Lab Tests/Imaging studies: I personally interpreted labs/imaging and the pertinent results include:  CBC and CMP normal.   CT head with previously identified chiari malformation and cervical cord syrinx. I agree with the radiologist interpretation.  Consultations obtained: I consulted with neurologist Dr Amada Jupiter who felt patient's symptoms likely related to his Chiari malformation, especially with its involvement with the cervical cord syrinx. He recommended consultation with neurosurgery. After reconsultation,  he recommended MRI brain and cervical spine w/ and w/o contrast.   I consulted with neurosurgeon Dr Conchita Paris who did not feel patient's symptoms are likely due to his chiari malformation, but felt the cervical cord syrinx was an indication for repair of the malformation at some point. After reconsultation, he recommended follow up outpatient to schedule surgical repair.    Disposition: After consideration of the diagnostic results and the patients response to treatment, I feel that emergency department workup does not suggest an emergent condition requiring admission or immediate intervention beyond what has been performed at this time. The plan is: discharge to home with neurosurgery follow up. No emergent need for surgical repair at this time per neurosurgery. The patient is safe for discharge and has been instructed to return immediately for worsening symptoms, change in symptoms or any  other concerns.  Final Clinical Impression(s) / ED Diagnoses Final diagnoses:  Syrinx of spinal cord (HCC)  Left arm numbness    Rx / DC Orders ED Discharge Orders     None      Portions of this report may have been transcribed using voice recognition software. Every effort was made to ensure accuracy; however, inadvertent computerized transcription errors may be present.    Jeanella Flattery 03/10/23 0416    Marily Memos, MD 03/10/23 [phone]

## 2023-03-09 NOTE — ED Provider Notes (Incomplete)
[place] EM[facility]GENCY DEPARTMENT AT [facility] Provider Note   CSN: [id number] Arrival date & time: 03/09/23  1608     History {Add pertinent medical, surgical, social history, OB history to HPI:1} Chief Complaint  Patient presents with  . Numbness    Todd Mcintyre is a 27 y.o. male with history of Chiari malformation who presents the emergency department complaining of numbness.  Patient was seen in the ER 9/28 for sudden onset diplopia, was diagnosed with cranial nerve IV palsy.  He was discharged with ophthalmology follow-up.  Patient states that about a day or so later he developed lower facial numbness on both sides of his face.  He does feel that he has some difficulty chewing and has been drooling more so on the left side due to the numbness.  About 4 days ago he woke up with left ventral forearm numbness and left hand numbness.  Since then he has been feeling like it is also harder to walk.  He was having some balance issues due to his eye, but even with this covered he feels that he has to pick up his left foot differently than his right.  He does not have any numbness in the legs. Pt saw his ophthalmologist today who sent him to the ER for evaluation of new neurologic deficits.   HPI     Home Medications Prior to Admission medications   Medication Sig Start Date End Date Taking? Authorizing Provider  ibuprofen (ADVIL,MOTRIN) 600 MG tablet Take 1 tablet (600 mg total) by mouth every 8 (eight) hours as needed. 08/10/17   Joni Reining, PA-C  methocarbamol (ROBAXIN) 500 MG tablet Take 1 tablet (500 mg total) by mouth 2 (two) times daily as needed for muscle spasms. Patient not taking: Reported on 10/22/2016 04/28/16   Everlene Farrier, PA-C  naproxen (NAPROSYN) 250 MG tablet Take 1 tablet (250 mg total) by mouth 2 (two) times daily with a meal. Patient not taking: Reported on 10/22/2016 04/28/16   Everlene Farrier, PA-C  oxyCODONE-acetaminophen (PERCOCET/ROXICET)  5-325 MG per tablet Take 1-2 tablets by mouth every 4 (four) hours as needed for moderate pain. Patient not taking: Reported on 10/22/2016 06/12/14   Gaynelle Adu, MD  traMADol (ULTRAM) 50 MG tablet Take 1 tablet (50 mg total) by mouth every 6 (six) hours as needed for moderate pain. 08/10/17   Joni Reining, PA-C      Allergies    Patient has no known allergies.    Review of Systems   Review of Systems  Neurological:  Positive for numbness.  All other systems reviewed and are negative.   Physical Exam Updated Vital Signs BP (!) 132/99 (BP Location: Right Arm)   Pulse 72   Temp 98.1 F (36.7 C) (Oral)   Resp 18   Ht 6' (1.829 m)   Wt 101.6 kg   SpO2 99%   BMI 30.38 kg/m  Physical Exam Vitals and nursing note reviewed.  Constitutional:      Appearance: Normal appearance.  HENT:     Head: Normocephalic and atraumatic.  Eyes:     Extraocular Movements:     Right eye: Abnormal extraocular motion present.     Conjunctiva/sclera: Conjunctivae normal.  Cardiovascular:     Rate and Rhythm: Normal rate and regular rhythm.  Pulmonary:     Effort: Pulmonary effort is normal. No respiratory distress.     Breath sounds: Normal breath sounds.  Abdominal:     General: There is  no distension.     Palpations: Abdomen is soft.     Tenderness: There is no abdominal tenderness.  Skin:    General: Skin is warm and dry.  Neurological:     Mental Status: He is alert.     Gait: Gait abnormal.     Comments: Cranial nerve IV palsy on the right Facial numbness from the bilateral cheeks down.  No facial droop or slurred speech. Left ventral arm and L total hand numbness 5/5 grip strength, bicep flexion/extension, shoulder extension 5/5 knee flexion/extension L partial foot drop with ambulating     ED Results / Procedures / Treatments   Labs (all labs ordered are listed, but only abnormal results are displayed) Labs Reviewed  COMPREHENSIVE METABOLIC PANEL - Abnormal; Notable for the  following components:      Result Value   Glucose, Bld 104 (*)    All other components within normal limits  PROTIME-INR  APTT  CBC  DIFFERENTIAL  ETHANOL    EKG None  Radiology CT HEAD WO CONTRAST  Result Date: 03/09/2023 CLINICAL DATA:  Double vision and numbness. EXAM: CT HEAD WITHOUT CONTRAST TECHNIQUE: Contiguous axial images were obtained from the base of the skull through the vertex without intravenous contrast. RADIATION DOSE REDUCTION: This exam was performed according to the departmental dose-optimization program which includes automated exposure control, adjustment of the mA and/or kV according to patient size and/or use of iterative reconstruction technique. COMPARISON:  Brain MRI 02/20/2023 FINDINGS: Brain: No evidence of acute infarction, hemorrhage, hydrocephalus, extra-axial collection or mass lesion/mass effect. There is a low-density in the right dorsal pons not seen on prior brain MRI and on sagittal reformat image 6 has a typical ring like artifact appearance. Chiari malformation with syrinx in the upper cervical cord without change, reference prior MRI measurements. Vascular: No hyperdense vessel or unexpected calcification. Skull: Normal. Negative for fracture or focal lesion. Sinuses/Orbits: No acute finding. IMPRESSION: 1. No acute finding. 2. Known Chiari malformation and cervical cord syrinx. Electronically Signed   By: Tiburcio Pea M.D.   On: 03/09/2023 20:16    Procedures Procedures  {Document cardiac monitor, telemetry assessment procedure when appropriate:1}  Medications Ordered in ED Medications - No data to display  ED Course/ Medical Decision Making/ A&P   {   Click here for ABCD2, HEART and other calculatorsREFRESH Note before signing :1}                              Medical Decision Making  This patient is a 27 y.o. male  who presents to the ED for concern of facial numbness, L arm numbness.   Differential diagnoses prior to evaluation: The  emergent differential diagnosis includes, but is not limited to,  CVA, spinal cord injury, cranial nerve palsy. This is not an exhaustive differential.   Past Medical History / Co-morbidities / Social History: Chiari malformation  Additional history: Chart reviewed. Pertinent results include: Reviewed ED records from 9/28 including MRI brain.   Physical Exam: Physical exam performed. The pertinent findings include: Mildly hypertensive. Abnormal gait with left foot drop. Left ventral forearm and L hand numbness. Normal strength. Persistent abnormal EOMI of R eye due to CN IV palsy.   Lab Tests/Imaging studies: I personally interpreted labs/imaging and the pertinent results include:  CBC and CMP normal.   CT head with previously identified chiari malformation and cervical cord syrinx. I agree with the radiologist interpretation.  Cardiac monitoring:  EKG obtained and interpreted by myself and attending physician which shows: ***   Medications: I ordered medication including ***.  I have reviewed the patients home medicines and have made adjustments as needed.  Consultations obtained: I consulted with neurologist Dr Amada Jupiter who felt patient's symptoms likely related to his Chiari malformation, especially with its involvement with the cervical cord syrinx. He recommended consultation with neurosurgery.  I consulted with neurosurgeon Dr Conchita Paris who did not feel patient's symptoms are likely due to his chiari malformation, but felt the cervical cord syrinx was an indication for repair of the malformation at some point.    Disposition: After consideration of the diagnostic results and the patients response to treatment, I feel that *** .   ***emergency department workup does not suggest an emergent condition requiring admission or immediate intervention beyond what has been performed at this time. The plan is: ***. The patient is safe for discharge and has been instructed to return  immediately for worsening symptoms, change in symptoms or any other concerns.  Final Clinical Impression(s) / ED Diagnoses Final diagnoses:  None    Rx / DC Orders ED Discharge Orders     None      Portions of this report may have been transcribed using voice recognition software. Every effort was made to ensure accuracy; however, inadvertent computerized transcription errors may be present.

## 2023-03-09 NOTE — ED Triage Notes (Addendum)
Pt reports being sent from eye appointment today, evaluated for optical nerve damage about weeks ago.  Pt complaining of numbness in L arm starting  about 3 days ago. Reports L facial numbness in as well since initial nerve damage- over 1 week.  Pt reports balance has also been off, but has slightly been worsening.  Pt Aox4, equal sensation upon assessment, no drift noted, 5+ strength in all extremities. Denies HA/dizziness.

## 2023-03-10 ENCOUNTER — Emergency Department (HOSPITAL_COMMUNITY): Payer: Medicaid Other

## 2023-03-10 MED ORDER — LORAZEPAM 2 MG/ML IJ SOLN: 0.5000 mg | Freq: Once | INTRAMUSCULAR | Status: DC | PRN

## 2023-03-10 MED ORDER — GADOBUTROL 1 MMOL/ML IV SOLN
7.0000 mL | Freq: Once | INTRAVENOUS | Status: AC | PRN
  Administered 2023-03-10: 7 mL via INTRAVENOUS

## 2023-03-10 NOTE — Discharge Instructions (Addendum)
You were seen in the ER for numbness.  As we discussed, you have what is called a syrinx (cyst) in your spinal canal. This is usually treated with surgery. I spoke with Dr Conchita Paris with [facility] who reviewed your images and wants to follow up with you in clinic.  I have attached their contact information, please call to make a follow-up appointment.  Continue to monitor how you're doing and return to the ER for new or worsening symptoms such as new weakness of your extremities.

## 2023-03-23 ENCOUNTER — Other Ambulatory Visit: Payer: Self-pay | Admitting: Neurosurgery

## 2023-03-30 ENCOUNTER — Other Ambulatory Visit: Payer: Self-pay | Admitting: Neurosurgery

## 2023-04-02 ENCOUNTER — Encounter (HOSPITAL_COMMUNITY): Payer: Self-pay | Admitting: Neurosurgery

## 2023-04-02 ENCOUNTER — Other Ambulatory Visit: Payer: Self-pay

## 2023-04-02 NOTE — Progress Notes (Signed)
PCP - none Cardiologist - none  Chest x-ray - n/a EKG - n/a Stress Test - n/a ECHO - n/a Cardiac Cath - n/a  ICD Pacemaker/Loop - n/a  Sleep Study -  n/a  Diabetes - n/a  NPO  STOP now taking any Aspirin (unless otherwise instructed by your surgeon), Aleve, Naproxen, Ibuprofen, Motrin, Advil, Goody's, BC's, all herbal medications, fish oil, and all vitamins.   Coronavirus Screening Do you have any of the following symptoms:  Cough yes/no: No Fever (>100.27F)  yes/no: No Runny nose yes/no: No Sore throat yes/no: No Difficulty breathing/shortness of breath  yes/no: No  Have you traveled in the last 14 days and where? yes/no: No  Patient verbalized understanding of instructions that were given via phone.

## 2023-04-06 ENCOUNTER — Inpatient Hospital Stay (HOSPITAL_COMMUNITY): Payer: Self-pay | Admitting: Anesthesiology

## 2023-04-06 ENCOUNTER — Inpatient Hospital Stay (HOSPITAL_COMMUNITY): Payer: Medicaid Other

## 2023-04-06 ENCOUNTER — Encounter (HOSPITAL_COMMUNITY): Payer: Self-pay | Admitting: Neurosurgery

## 2023-04-06 ENCOUNTER — Encounter (HOSPITAL_COMMUNITY)
Admission: RE | Disposition: A | Discharge status: Home / Self Care | Payer: Self-pay | Source: Home / Self Care | Attending: Neurosurgery

## 2023-04-06 ENCOUNTER — Other Ambulatory Visit: Payer: Self-pay

## 2023-04-06 ENCOUNTER — Inpatient Hospital Stay (HOSPITAL_COMMUNITY)
Admission: RE | Admit: 2023-04-06 | Discharge: 2023-04-09 | DRG: 026 | Disposition: A | Discharge status: Home / Self Care | Payer: Medicaid Other | Attending: Neurosurgery | Admitting: Neurosurgery

## 2023-04-06 DIAGNOSIS — Q0700 Arnold-Chiari syndrome without spina bifida or hydrocephalus: Secondary | ICD-10-CM

## 2023-04-06 DIAGNOSIS — Z87820 Personal history of traumatic brain injury: Secondary | ICD-10-CM | POA: Diagnosis not present

## 2023-04-06 DIAGNOSIS — R29810 Facial weakness: Secondary | ICD-10-CM | POA: Diagnosis present

## 2023-04-06 DIAGNOSIS — Z833 Family history of diabetes mellitus: Secondary | ICD-10-CM

## 2023-04-06 DIAGNOSIS — F17290 Nicotine dependence, other tobacco product, uncomplicated: Secondary | ICD-10-CM | POA: Diagnosis present

## 2023-04-06 DIAGNOSIS — R2689 Other abnormalities of gait and mobility: Secondary | ICD-10-CM | POA: Diagnosis present

## 2023-04-06 DIAGNOSIS — G935 Compression of brain: Principal | ICD-10-CM | POA: Diagnosis present

## 2023-04-06 DIAGNOSIS — R531 Weakness: Secondary | ICD-10-CM | POA: Diagnosis present

## 2023-04-06 DIAGNOSIS — G950 Syringomyelia and syringobulbia: Secondary | ICD-10-CM | POA: Diagnosis present

## 2023-04-06 DIAGNOSIS — G529 Cranial nerve disorder, unspecified: Secondary | ICD-10-CM | POA: Diagnosis present

## 2023-04-06 DIAGNOSIS — R059 Cough, unspecified: Secondary | ICD-10-CM | POA: Diagnosis present

## 2023-04-06 HISTORY — PX: SUBOCCIPITAL CRANIECTOMY CERVICAL LAMINECTOMY: SHX5404

## 2023-04-06 LAB — TYPE AND SCREEN
ABO/RH(D): O NEG
Antibody Screen: NEGATIVE

## 2023-04-06 LAB — ABO/RH: ABO/RH(D): O NEG

## 2023-04-06 LAB — MRSA NEXT GEN BY PCR, NASAL: MRSA by PCR Next Gen: NOT DETECTED

## 2023-04-06 SURGERY — SUBOCCIPITAL CRANIECTOMY CERVICAL LAMINECTOMY/DURAPLASTY
Anesthesia: General

## 2023-04-06 MED ORDER — PHENYLEPHRINE 80 MCG/ML (10ML) SYRINGE FOR IV PUSH (FOR BLOOD PRESSURE SUPPORT)
PREFILLED_SYRINGE | INTRAVENOUS | Status: AC
  Filled 2023-04-06: qty 10

## 2023-04-06 MED ORDER — OXYCODONE HCL 5 MG PO TABS
5.0000 mg | ORAL_TABLET | ORAL | Status: DC | PRN
  Administered 2023-04-07 (×3): 5 mg via ORAL
  Filled 2023-04-06 (×3): qty 1

## 2023-04-06 MED ORDER — PHENYLEPHRINE 80 MCG/ML (10ML) SYRINGE FOR IV PUSH (FOR BLOOD PRESSURE SUPPORT)
PREFILLED_SYRINGE | INTRAVENOUS | Status: DC | PRN
  Administered 2023-04-06 (×2): 80 ug via INTRAVENOUS

## 2023-04-06 MED ORDER — 0.9 % SODIUM CHLORIDE (POUR BTL) OPTIME
TOPICAL | Status: DC | PRN
  Administered 2023-04-06: 1000 mL

## 2023-04-06 MED ORDER — MICROFIBRILLAR COLL HEMOSTAT EX PADS
MEDICATED_PAD | CUTANEOUS | Status: DC | PRN
  Administered 2023-04-06: 1 via TOPICAL

## 2023-04-06 MED ORDER — MIDAZOLAM HCL 2 MG/2ML IJ SOLN
INTRAMUSCULAR | Status: AC
Start: 2023-04-06 — End: ?
  Filled 2023-04-06: qty 2

## 2023-04-06 MED ORDER — ACETAMINOPHEN 10 MG/ML IV SOLN: 1000.0000 mg | Freq: Once | INTRAVENOUS | Status: DC | PRN

## 2023-04-06 MED ORDER — SODIUM CHLORIDE 0.9% FLUSH: 10.0000 mL | Freq: Two times a day (BID) | INTRAVENOUS | Status: DC

## 2023-04-06 MED ORDER — ORAL CARE MOUTH RINSE: 15.0000 mL | Freq: Once | OROMUCOSAL | Status: AC

## 2023-04-06 MED ORDER — FENTANYL CITRATE (PF) 250 MCG/5ML IJ SOLN
INTRAMUSCULAR | Status: DC | PRN
  Administered 2023-04-06: 150 ug via INTRAVENOUS
  Administered 2023-04-06 (×2): 50 ug via INTRAVENOUS

## 2023-04-06 MED ORDER — CEFAZOLIN SODIUM-DEXTROSE 2-4 GM/100ML-% IV SOLN
INTRAVENOUS | Status: AC
  Filled 2023-04-06: qty 100

## 2023-04-06 MED ORDER — THROMBIN 20000 UNITS EX SOLR
CUTANEOUS | Status: AC
  Filled 2023-04-06: qty 20000

## 2023-04-06 MED ORDER — MICROFIBRILLAR COLL HEMOSTAT EX POWD
CUTANEOUS | Status: AC
  Filled 2023-04-06: qty 5

## 2023-04-06 MED ORDER — LIDOCAINE 2% (20 MG/ML) 5 ML SYRINGE
INTRAMUSCULAR | Status: AC
Start: 2023-04-06 — End: ?
  Filled 2023-04-06: qty 5

## 2023-04-06 MED ORDER — THROMBIN 20000 UNITS EX SOLR
CUTANEOUS | Status: DC | PRN
  Administered 2023-04-06: 20 mL via TOPICAL

## 2023-04-06 MED ORDER — THROMBIN 5000 UNITS EX SOLR
CUTANEOUS | Status: AC
Start: 2023-04-06 — End: ?
  Filled 2023-04-06: qty 5000

## 2023-04-06 MED ORDER — ROCURONIUM BROMIDE 10 MG/ML (PF) SYRINGE
PREFILLED_SYRINGE | INTRAVENOUS | Status: DC | PRN
  Administered 2023-04-06: 100 mg via INTRAVENOUS

## 2023-04-06 MED ORDER — ONDANSETRON HCL 4 MG/2ML IJ SOLN: 4.0000 mg | Freq: Once | INTRAMUSCULAR | Status: DC | PRN

## 2023-04-06 MED ORDER — PROPOFOL 10 MG/ML IV BOLUS
INTRAVENOUS | Status: DC | PRN
  Administered 2023-04-06: 50 mg via INTRAVENOUS
  Administered 2023-04-06: 250 mg via INTRAVENOUS

## 2023-04-06 MED ORDER — CHLORHEXIDINE GLUCONATE CLOTH 2 % EX PADS: 6.0000 | MEDICATED_PAD | Freq: Once | CUTANEOUS | Status: DC

## 2023-04-06 MED ORDER — PANTOPRAZOLE SODIUM 40 MG IV SOLR
40.0000 mg | Freq: Every day | INTRAVENOUS | Status: DC
  Administered 2023-04-06: 40 mg via INTRAVENOUS
  Filled 2023-04-06: qty 10

## 2023-04-06 MED ORDER — CHLORHEXIDINE GLUCONATE CLOTH 2 % EX PADS
6.0000 | MEDICATED_PAD | Freq: Every day | CUTANEOUS | Status: DC
  Administered 2023-04-06 – 2023-04-08 (×3): 6 via TOPICAL

## 2023-04-06 MED ORDER — FENTANYL CITRATE (PF) 100 MCG/2ML IJ SOLN
INTRAMUSCULAR | Status: AC
  Filled 2023-04-06: qty 2

## 2023-04-06 MED ORDER — OXYCODONE HCL 5 MG PO TABS: 5.0000 mg | ORAL_TABLET | Freq: Once | ORAL | Status: DC | PRN

## 2023-04-06 MED ORDER — DIAZEPAM 2 MG PO TABS
2.0000 mg | ORAL_TABLET | Freq: Four times a day (QID) | ORAL | Status: AC
  Administered 2023-04-06 – 2023-04-08 (×8): 2 mg via ORAL
  Filled 2023-04-06 (×8): qty 1

## 2023-04-06 MED ORDER — CEFAZOLIN SODIUM-DEXTROSE 2-4 GM/100ML-% IV SOLN
2.0000 g | Freq: Three times a day (TID) | INTRAVENOUS | Status: AC
  Administered 2023-04-06 – 2023-04-07 (×2): 2 g via INTRAVENOUS
  Filled 2023-04-06 (×3): qty 100

## 2023-04-06 MED ORDER — CEFAZOLIN SODIUM-DEXTROSE 2-4 GM/100ML-% IV SOLN
2.0000 g | INTRAVENOUS | Status: AC
  Administered 2023-04-06: 2 g via INTRAVENOUS

## 2023-04-06 MED ORDER — MANNITOL 20 % IV SOLN
25.0000 g | Freq: Once | INTRAVENOUS | Status: DC
  Filled 2023-04-06: qty 250

## 2023-04-06 MED ORDER — CHLORHEXIDINE GLUCONATE 0.12 % MT SOLN: 15.0000 mL | Freq: Once | OROMUCOSAL | Status: AC

## 2023-04-06 MED ORDER — LIDOCAINE-EPINEPHRINE 1 %-1:100000 IJ SOLN
INTRAMUSCULAR | Status: DC | PRN
  Administered 2023-04-06: 5 mL via INTRADERMAL

## 2023-04-06 MED ORDER — BUPIVACAINE HCL (PF) 0.5 % IJ SOLN
INTRAMUSCULAR | Status: AC
  Filled 2023-04-06: qty 30

## 2023-04-06 MED ORDER — PROMETHAZINE HCL 25 MG PO TABS: 12.5000 mg | ORAL_TABLET | ORAL | Status: DC | PRN

## 2023-04-06 MED ORDER — THROMBIN 5000 UNITS EX SOLR
OROMUCOSAL | Status: DC | PRN
  Administered 2023-04-06: 5 mL via TOPICAL

## 2023-04-06 MED ORDER — SODIUM CHLORIDE 0.9 % IV SOLN
INTRAVENOUS | Status: DC | PRN
Start: 2023-04-06 — End: 2023-04-06

## 2023-04-06 MED ORDER — SUGAMMADEX SODIUM 200 MG/2ML IV SOLN
INTRAVENOUS | Status: DC | PRN
  Administered 2023-04-06: 200 mg via INTRAVENOUS

## 2023-04-06 MED ORDER — HYDROCODONE-ACETAMINOPHEN 5-325 MG PO TABS
1.0000 | ORAL_TABLET | ORAL | Status: DC | PRN
Start: 2023-04-06 — End: 2023-04-06
  Administered 2023-04-06: 1 via ORAL
  Filled 2023-04-06: qty 1

## 2023-04-06 MED ORDER — ORAL CARE MOUTH RINSE: 15.0000 mL | OROMUCOSAL | Status: DC | PRN

## 2023-04-06 MED ORDER — SODIUM CHLORIDE 0.9 % IV SOLN
0.1500 ug/kg/min | INTRAVENOUS | Status: DC
  Administered 2023-04-06: .1 ug/kg/min via INTRAVENOUS
  Administered 2023-04-06: .08 ug/kg/min via INTRAVENOUS
  Filled 2023-04-06 (×2): qty 2000

## 2023-04-06 MED ORDER — SENNOSIDES-DOCUSATE SODIUM 8.6-50 MG PO TABS: 1.0000 | ORAL_TABLET | Freq: Every evening | ORAL | Status: DC | PRN

## 2023-04-06 MED ORDER — OXYCODONE HCL 5 MG PO TABS: 5.0000 mg | ORAL_TABLET | ORAL | Status: DC | PRN

## 2023-04-06 MED ORDER — ONDANSETRON HCL 4 MG PO TABS
4.0000 mg | ORAL_TABLET | ORAL | Status: DC | PRN
Start: 2023-04-06 — End: 2023-04-09

## 2023-04-06 MED ORDER — ONDANSETRON HCL 4 MG/2ML IJ SOLN: 4.0000 mg | INTRAMUSCULAR | Status: DC | PRN

## 2023-04-06 MED ORDER — MORPHINE SULFATE (PF) 2 MG/ML IV SOLN
1.0000 mg | INTRAVENOUS | Status: DC | PRN
  Administered 2023-04-06 (×3): 2 mg via INTRAVENOUS
  Filled 2023-04-06 (×3): qty 1

## 2023-04-06 MED ORDER — ROCURONIUM BROMIDE 10 MG/ML (PF) SYRINGE
PREFILLED_SYRINGE | INTRAVENOUS | Status: AC
  Filled 2023-04-06: qty 10

## 2023-04-06 MED ORDER — LIDOCAINE-EPINEPHRINE 1 %-1:100000 IJ SOLN
INTRAMUSCULAR | Status: AC
Start: 2023-04-06 — End: ?
  Filled 2023-04-06: qty 1

## 2023-04-06 MED ORDER — BISACODYL 10 MG RE SUPP: 10.0000 mg | Freq: Every day | RECTAL | Status: DC | PRN

## 2023-04-06 MED ORDER — ONDANSETRON HCL 4 MG/2ML IJ SOLN
INTRAMUSCULAR | Status: DC | PRN
  Administered 2023-04-06: 4 mg via INTRAVENOUS

## 2023-04-06 MED ORDER — CHLORHEXIDINE GLUCONATE 0.12 % MT SOLN
OROMUCOSAL | Status: AC
  Administered 2023-04-06: 15 mL via OROMUCOSAL
  Filled 2023-04-06: qty 15

## 2023-04-06 MED ORDER — ACETAMINOPHEN 325 MG PO TABS
650.0000 mg | ORAL_TABLET | ORAL | Status: DC | PRN
  Administered 2023-04-07 – 2023-04-09 (×2): 650 mg via ORAL
  Filled 2023-04-06 (×2): qty 2

## 2023-04-06 MED ORDER — FENTANYL CITRATE (PF) 100 MCG/2ML IJ SOLN
25.0000 ug | INTRAMUSCULAR | Status: DC | PRN
  Administered 2023-04-06 (×2): 50 ug via INTRAVENOUS

## 2023-04-06 MED ORDER — OXYCODONE HCL 5 MG/5ML PO SOLN: 5.0000 mg | Freq: Once | ORAL | Status: DC | PRN

## 2023-04-06 MED ORDER — SODIUM CHLORIDE 0.9 % IV SOLN
0.1000 ug/kg/min | INTRAVENOUS | Status: DC
  Filled 2023-04-06: qty 1000

## 2023-04-06 MED ORDER — PHENYLEPHRINE HCL-NACL 20-0.9 MG/250ML-% IV SOLN
INTRAVENOUS | Status: DC | PRN
  Administered 2023-04-06: 40 ug/min via INTRAVENOUS

## 2023-04-06 MED ORDER — DEXAMETHASONE SODIUM PHOSPHATE 10 MG/ML IJ SOLN
INTRAMUSCULAR | Status: DC | PRN
  Administered 2023-04-06: 10 mg via INTRAVENOUS

## 2023-04-06 MED ORDER — FENTANYL CITRATE (PF) 250 MCG/5ML IJ SOLN
INTRAMUSCULAR | Status: AC
  Filled 2023-04-06: qty 5

## 2023-04-06 MED ORDER — POVIDONE-IODINE 10 % EX OINT
TOPICAL_OINTMENT | CUTANEOUS | Status: AC
Start: 2023-04-06 — End: ?
  Filled 2023-04-06: qty 28.35

## 2023-04-06 MED ORDER — EPHEDRINE 5 MG/ML INJ
INTRAVENOUS | Status: AC
  Filled 2023-04-06: qty 5

## 2023-04-06 MED ORDER — MIDAZOLAM HCL 2 MG/2ML IJ SOLN
INTRAMUSCULAR | Status: DC | PRN
  Administered 2023-04-06: 2 mg via INTRAVENOUS

## 2023-04-06 MED ORDER — POVIDONE-IODINE 10 % EX OINT
TOPICAL_OINTMENT | CUTANEOUS | Status: DC | PRN
  Administered 2023-04-06: 1 via TOPICAL

## 2023-04-06 MED ORDER — BUPIVACAINE HCL (PF) 0.5 % IJ SOLN
INTRAMUSCULAR | Status: DC | PRN
  Administered 2023-04-06: 5 mL

## 2023-04-06 MED ORDER — LACTATED RINGERS IV SOLN: INTRAVENOUS | Status: DC | PRN

## 2023-04-06 MED ORDER — ACETAMINOPHEN 650 MG RE SUPP: 650.0000 mg | RECTAL | Status: DC | PRN

## 2023-04-06 MED ORDER — PROPOFOL 10 MG/ML IV BOLUS
INTRAVENOUS | Status: AC
  Filled 2023-04-06: qty 20

## 2023-04-06 MED ORDER — LIDOCAINE 2% (20 MG/ML) 5 ML SYRINGE
INTRAMUSCULAR | Status: DC | PRN
  Administered 2023-04-06: 100 mg via INTRAVENOUS

## 2023-04-06 MED ORDER — LABETALOL HCL 5 MG/ML IV SOLN
10.0000 mg | INTRAVENOUS | Status: DC | PRN
  Administered 2023-04-06 – 2023-04-07 (×2): 20 mg via INTRAVENOUS
  Filled 2023-04-06 (×2): qty 4

## 2023-04-06 MED ORDER — HYDROMORPHONE HCL 1 MG/ML IJ SOLN
0.5000 mg | INTRAMUSCULAR | Status: DC | PRN
  Administered 2023-04-06 – 2023-04-08 (×6): 0.5 mg via INTRAVENOUS
  Filled 2023-04-06 (×6): qty 1

## 2023-04-06 SURGICAL SUPPLY — 59 items
ADH SKN CLS APL DERMABOND .7 (GAUZE/BANDAGES/DRESSINGS) ×1
APL SKNCLS STERI-STRIP NONHPOA (GAUZE/BANDAGES/DRESSINGS)
BAG COUNTER SPONGE SURGICOUNT (BAG) ×1 IMPLANT
BAG SPNG CNTER NS LX DISP (BAG) ×1
BENZOIN TINCTURE PRP APPL 2/3 (GAUZE/BANDAGES/DRESSINGS) IMPLANT
BLADE CLIPPER SURG (BLADE) ×2 IMPLANT
BLADE SURG 11 STRL SS (BLADE) ×1 IMPLANT
BLADE ULTRA TIP 2M (BLADE) IMPLANT
BUR PRECISION FLUTE 5.0 (BURR) ×1 IMPLANT
BUR SPIRAL ROUTER 2.3 (BUR) IMPLANT
CANISTER SUCT 3000ML PPV (MISCELLANEOUS) ×1 IMPLANT
CLIP TI MEDIUM 6 (CLIP) IMPLANT
COVER BACK TABLE 60X90IN (DRAPES) IMPLANT
DERMABOND ADVANCED .7 DNX12 (GAUZE/BANDAGES/DRESSINGS) IMPLANT
DRAPE LAPAROTOMY 100X72 PEDS (DRAPES) ×1 IMPLANT
DRAPE MICROSCOPE SLANT 54X150 (MISCELLANEOUS) IMPLANT
DRAPE WARM FLUID 44X44 (DRAPES) ×1 IMPLANT
DRSG OPSITE POSTOP 4X8 (GAUZE/BANDAGES/DRESSINGS) IMPLANT
DURAPREP 6ML APPLICATOR 50/CS (WOUND CARE) ×1 IMPLANT
ELECT REM PT RETURN 9FT ADLT (ELECTROSURGICAL) ×1
ELECTRODE REM PT RTRN 9FT ADLT (ELECTROSURGICAL) ×1 IMPLANT
EVACUATOR 1/8 PVC DRAIN (DRAIN) IMPLANT
EVACUATOR SILICONE 100CC (DRAIN) IMPLANT
GAUZE 4X4 16PLY ~~LOC~~+RFID DBL (SPONGE) IMPLANT
GAUZE SPONGE 4X4 12PLY STRL (GAUZE/BANDAGES/DRESSINGS) IMPLANT
GLOVE BIOGEL PI IND STRL 7.5 (GLOVE) ×1 IMPLANT
GLOVE ECLIPSE 7.0 STRL STRAW (GLOVE) ×1 IMPLANT
GOWN STRL REUS W/ TWL LRG LVL3 (GOWN DISPOSABLE) ×1 IMPLANT
GOWN STRL REUS W/ TWL XL LVL3 (GOWN DISPOSABLE) IMPLANT
GOWN STRL REUS W/TWL 2XL LVL3 (GOWN DISPOSABLE) IMPLANT
GOWN STRL REUS W/TWL LRG LVL3 (GOWN DISPOSABLE) ×1
GOWN STRL REUS W/TWL XL LVL3 (GOWN DISPOSABLE)
GRAFT PERICARIUM DURAL BV 4X5 (Graft) IMPLANT
HEMOSTAT POWDER KIT SURGIFOAM (HEMOSTASIS) ×1 IMPLANT
HEMOSTAT SURGICEL 2X14 (HEMOSTASIS) IMPLANT
KIT BASIN OR (CUSTOM PROCEDURE TRAY) ×1 IMPLANT
KIT TURNOVER KIT B (KITS) ×1 IMPLANT
NDL HYPO 22X1.5 SAFETY MO (MISCELLANEOUS) ×1 IMPLANT
NEEDLE HYPO 22X1.5 SAFETY MO (MISCELLANEOUS) ×1 IMPLANT
NS IRRIG 1000ML POUR BTL (IV SOLUTION) ×1 IMPLANT
PACK CRANIOTOMY CUSTOM (CUSTOM PROCEDURE TRAY) ×1 IMPLANT
PAD ARMBOARD 7.5X6 YLW CONV (MISCELLANEOUS) ×3 IMPLANT
PATTIES SURGICAL .5 X1 (DISPOSABLE) IMPLANT
PATTIES SURGICAL 1/4 X 3 (GAUZE/BANDAGES/DRESSINGS) IMPLANT
SEALANT ADHERUS EXTEND TIP (MISCELLANEOUS) IMPLANT
SPONGE SURGIFOAM ABS GEL 100 (HEMOSTASIS) ×1 IMPLANT
SPONGE T-LAP 4X18 ~~LOC~~+RFID (SPONGE) IMPLANT
STAPLER SKIN PROX WIDE 3.9 (STAPLE) IMPLANT
SUT ETHILON 3 0 FSL (SUTURE) IMPLANT
SUT NURALON 4 0 TR CR/8 (SUTURE) IMPLANT
SUT PROLENE 6 0 BV (SUTURE) ×4 IMPLANT
SUT VIC AB 0 CT1 18XCR BRD8 (SUTURE) ×2 IMPLANT
SUT VIC AB 0 CT1 8-18 (SUTURE) ×2
SUT VICRYL 3-0 RB1 18 ABS (SUTURE) ×2 IMPLANT
TOWEL GREEN STERILE (TOWEL DISPOSABLE) ×1 IMPLANT
TOWEL GREEN STERILE FF (TOWEL DISPOSABLE) IMPLANT
TRAY FOLEY MTR SLVR 16FR STAT (SET/KITS/TRAYS/PACK) IMPLANT
UNDERPAD 30X36 HEAVY ABSORB (UNDERPADS AND DIAPERS) IMPLANT
WATER STERILE IRR 1000ML POUR (IV SOLUTION) ×1 IMPLANT

## 2023-04-06 NOTE — Op Note (Signed)
NEUROSURGERY OPERATIVE NOTE   PREOP DIAGNOSIS:  Chiari malformation   POSTOP DIAGNOSIS: Same  PROCEDURE: Suboccipital craniectomy with expansile duraplasty C1 laminectomy Use of intraperative microscope for microdissection  SURGEON: Dr. Lisbeth Renshaw, MD  ASSISTANT: Dr. Hoyt Koch, MD  ANESTHESIA: General Endotracheal  EBL: Minimal  SPECIMENS: None  DRAINS: None  COMPLICATIONS: None immediate  CONDITION: Hemodynamically stable to PACU  HISTORY: Todd Mcintyre is a 27 y.o. male presenting to the outpatient neurosurgery clinic with primarily facial numbness as well as progressively worsening gait instability.  His MRI brain and cervical spine has revealed Chiari I malformation with significant crowding at the level of the foramen magnum.  He has an associated large cervicothoracic syrinx with superior extension including syringobulbia.  We therefore recommended posterior fossa decompression.  The risks, benefits, and alternatives to surgery were all reviewed in detail with the patient and his family.  After all questions were answered informed consent was obtained and witnessed.  PROCEDURE IN DETAIL: The patient was brought to the operating room. After induction of general anesthesia, the patient was positioned on the operative table in the prone position and the Mayfield head holder. All pressure points were meticulously padded.  Care was taken to maintain neutral alignment with slight military tuck of the chin.  Skin incision was then marked out and prepped and draped in the usual sterile fashion.  After timeout was conducted, the midline skin incision extending from the inion down to the mid cervical spine was infiltrated with local anesthetic with epinephrine.  Incision was then made sharply and carried down through subcutaneous tissue.  The nuchal fascia was then identified.  The fascia was incised in a Y-shaped fashion based superiorly.  Suboccipital musculature  was then divided in the avascular midline plane and the occipital bone and ring of C1 and C2 are identified.  Subperiosteal dissection was then carried out along the C1 and C2 lamina as well as the occipital bone to identify the lateral edge of the foramen magnum.  At this point the high-speed drill was used to fashion a suboccipital craniectomy.  C1 lamina was also removed with a high-speed drill and Kerrison punches.  The atlantooccipital ligament was then identified and divided in the midline and peeled laterally.  I then expanded the craniectomy at the level of the foramen magnum laterally in order to fully open up the foramen magnum to its lateral edges.  At this point the dura was opened in a V shape extending from the cerebellar hemispheres down to the level of about C1.  Tonsils were noted to be extending through the foramen magnum to approximately this level.  A dural graft was then soaked in normal saline and cut to size.  The dural graft was then sutured in place in a watertight fashion using a combination of interrupted 4-0 Nurolon stitches.  The suture line was then covered with a layer of polyethylene glycol sealant.  Self-retaining retractor was then removed and hemostasis was secured on the muscle edges with bipolar.  The wound was then closed in standard fashion in multiple layers using a combination of interrupted 0 and 3-0 Vicryl stitches.  Dermabond was applied.  Once this dried a sterile dressing was placed.  The patient was then transferred to the stretcher and the Mayfield head holder was removed.  He was then extubated and taken to the postanesthesia care unit in stable hemodynamic condition.  At the end of the case all sponge, instrument, cottonoid counts were correct.  There  was 1 needle which was initially lost and lateral x-ray was taken.  There was no needle within the wound.  The needle was subsequently found.   Lisbeth Renshaw, MD [facility]

## 2023-04-06 NOTE — Transfer of Care (Signed)
Immediate Anesthesia Transfer of Care Note  Patient: Todd Mcintyre  Procedure(s) Performed: CHIARI DECOMPRESSION  Patient Location: PACU  Anesthesia Type:General  Level of Consciousness: awake, alert , and oriented  Airway & Oxygen Therapy: Patient Spontanous Breathing and Patient connected to face mask  Post-op Assessment: Report given to RN, Post -op Vital signs reviewed and stable, and Patient moving all extremities  Post vital signs: Reviewed and stable  Last Vitals:  Vitals Value Taken Time  BP 139/99 04/06/23 1515  Temp    Pulse 103 04/06/23 1516  Resp 28 04/06/23 1516  SpO2 94 % 04/06/23 1516  Vitals shown include unfiled device data.  Last Pain:  Vitals:   04/06/23 1011  PainSc: 0-No pain         Complications: No notable events documented.

## 2023-04-06 NOTE — Consult Note (Signed)
NAME:  Todd Mcintyre, MRN:  [id number], DOB:  December 16, 1995, LOS: 0 ADMISSION DATE:  04/06/2023, CONSULTATION DATE: 04/06/2023 REFERRING MD: Dr. Conchita Paris,, CHIEF COMPLAINT: Status post decompression of Arnold-Chiari malformation  History of Present Illness:  He is s/p Arnold-Chiari malformation repair He had recently been seen in the emergency department for right-sided facial droop, right-sided weakness, blurry vision -MRI at the time did note a syrinx.  There was some progression of his symptoms and was seen again with a repeat MRI did show a large cervical thoracic syrinx Schedule for surgery and surgery was performed 04/06/2023  Pertinent  Medical History  Has no chronic ongoing medical problems outside his recent presentation  Significant Hospital Events: Including procedures, antibiotic start and stop dates in addition to other pertinent events   MRI-IMPRESSION: 1. Extensive spinal cord syrinx extending from the brainstem through the cervical and imaged thoracic spinal cord, measuring up to 8 x 16 mm in the axial plane. 2. Extension of the cerebellar tonsils through the foramen magnum is better evaluated on the same day MRI head. 3. C3-C4 mild-to-moderate left neural foraminal narrowing. No spinal canal stenosis.  Interim History / Subjective:  Is awake alert interactive Some discomfort around his neck otherwise has no other significant complaints  Objective   Blood pressure (!) 150/98, pulse 88, temperature 98 F (36.7 C), temperature source Oral, resp. rate 17, height 6' (1.829 m), weight 104.3 kg, SpO2 99%.        Intake/Output Summary (Last 24 hours) at 04/06/2023 1720 Last data filed at 04/06/2023 1700 Gross per 24 hour  Intake 1150 ml  Output 1225 ml  Net -75 ml   Filed Weights   04/02/23 1613 04/06/23 1014  Weight: 104.3 kg 104.3 kg  Examination: General: Young gentleman, does not appear to be in distress HENT: Moist oral mucosa Lungs: Clear breath  sounds Cardiovascular: S1-S2 appreciated Abdomen: Soft, bowel sounds appreciated Extremities: No clubbing, no edema Neuro: Awake alert interactive, moving all extremities GU: Thad Ranger output  [facility] Problem list     Assessment & Plan:  S/p Arnold-Chiari malformation repair postop day 0.  Had posterior fossa decompression -Suboccipital craniectomy with expansile duraplasty with C1 laminectomy -Tolerated surgery well  Hemodynamically stable  Neurochecks per protocol  Pain management Blood pressure management Cough management Bowel regimen Antibiotics  Best Practice (right click and "Reselect all SmartList Selections" daily)   Diet/type: Regular consistency (see orders) DVT prophylaxis: SCD GI prophylaxis: PPI Lines: N/A Foley:  N/A Code Status:  full code Last date of multidisciplinary goals of care discussion [patient fully alert and interactive]  Labs   CBC: No results for input(s): "WBC", "NEUTROABS", "HGB", "HCT", "MCV", "PLT" in the last 168 hours.  Basic Metabolic Panel: No results for input(s): "NA", "K", "CL", "CO2", "GLUCOSE", "BUN", "CREATININE", "CALCIUM", "MG", "PHOS" in the last 168 hours. GFR: CrCl cannot be calculated (Patient's most recent lab result is older than the maximum 21 days allowed.). No results for input(s): "PROCALCITON", "WBC", "LATICACIDVEN" in the last 168 hours.  Liver Function Tests: No results for input(s): "AST", "ALT", "ALKPHOS", "BILITOT", "PROT", "ALBUMIN" in the last 168 hours. No results for input(s): "LIPASE", "AMYLASE" in the last 168 hours. No results for input(s): "AMMONIA" in the last 168 hours.  ABG    Component Value Date/Time   TCO2 26 03/18/2014 0238     Coagulation Profile: No results for input(s): "INR", "PROTIME" in the last 168 hours.  Cardiac Enzymes: No results for input(s): "CKTOTAL", "CKMB", "CKMBINDEX", "TROPONINI" in  the last 168 hours.  HbA1C: No results found for: "HGBA1C"  CBG: No  results for input(s): "GLUCAP" in the last 168 hours.  Review of Systems:   GEN is fully awake alert Denies significant pain or discomfort at present  Past Medical History:  He,  has a past medical history of Concussion without loss of consciousness (02/06/2013), Neuromuscular disorder (HCC) (03/09/2023), and Syrinx of spinal cord (HCC) (03/09/2023).   Surgical History:   Past Surgical History:  Procedure Laterality Date   ADENOIDECTOMY     LAPAROSCOPIC APPENDECTOMY N/A 06/11/2014   Procedure: APPENDECTOMY LAPAROSCOPIC;  Surgeon: Atilano Ina, MD;  Location: [facility] OR;  Service: General;  Laterality: N/A;   TONSILLECTOMY       Social History:   reports that he has been smoking cigars. He has never used smokeless tobacco. He reports current alcohol use. He reports current drug use. Drug: Marijuana.   Family History:  His family history includes Diabetes in his maternal grandmother.   Allergies No Known Allergies   Virl Diamond, MD [place] [facility] Pager: See Loretha Stapler

## 2023-04-06 NOTE — Anesthesia Preprocedure Evaluation (Signed)
Anesthesia Evaluation  Patient identified by MRN, date of birth, ID band Patient awake    Reviewed: Allergy & Precautions, NPO status , Patient's Chart, lab work & pertinent test results, reviewed documented beta blocker date and time   History of Anesthesia Complications Negative for: history of anesthetic complications  Airway Mallampati: I  TM Distance: >3 FB     Dental no notable dental hx.    Pulmonary neg sleep apnea, neg COPD, Current Smoker   breath sounds clear to auscultation       Cardiovascular (-) hypertension(-) angina (-) CAD, (-) Past MI and (-) CABG  Rhythm:Regular Rate:Normal     Neuro/Psych neg Seizures Diplopia, weakness/tingling in arms and legs  Neuromuscular disease    GI/Hepatic ,neg GERD  ,,(+) neg Cirrhosis        Endo/Other  neg diabetes    Renal/GU Renal disease     Musculoskeletal   Abdominal   Peds  Hematology   Anesthesia Other Findings   Reproductive/Obstetrics                              Anesthesia Physical Anesthesia Plan  ASA: 2  Anesthesia Plan: General   Post-op Pain Management:    Induction: Intravenous  PONV Risk Score and Plan: 1 and Ondansetron  Airway Management Planned: Oral ETT  Additional Equipment: Arterial line  Intra-op Plan:   Post-operative Plan: Extubation in OR  Informed Consent: I have reviewed the patients History and Physical, chart, labs and discussed the procedure including the risks, benefits and alternatives for the proposed anesthesia with the patient or authorized representative who has indicated his/her understanding and acceptance.     Dental advisory given  Plan Discussed with: CRNA  Anesthesia Plan Comments:          Anesthesia Quick Evaluation

## 2023-04-06 NOTE — Anesthesia Procedure Notes (Signed)
Procedure Name: Intubation Date/Time: 04/06/2023 11:12 AM  Performed by: Heron Sabins, CRNAPre-anesthesia Checklist: Patient identified, Emergency Drugs available, Suction available and Patient being monitored Patient Re-evaluated:Patient Re-evaluated prior to induction Oxygen Delivery Method: Circle System Utilized Preoxygenation: Pre-oxygenation with 100% oxygen Induction Type: IV induction Ventilation: Mask ventilation without difficulty Laryngoscope Size: Mac and 4 Grade View: Grade II Tube type: Oral Tube size: 7.5 mm Number of attempts: 1 Airway Equipment and Method: Stylet and Oral airway Placement Confirmation: ETT inserted through vocal cords under direct vision, positive ETCO2 and breath sounds checked- equal and bilateral Tube secured with: Tape Dental Injury: Teeth and Oropharynx as per pre-operative assessment

## 2023-04-06 NOTE — H&P (Signed)
Chief Complaint   Chiari Malformation  History of Present Illness  Todd Mcintyre is a 27 year old man I am seeing for initial consultation after recent visit to the emergency department for right-sided facial droop, blurry vision, and left sided weakness. Briefly, patient reports onset of symptoms rather acutely a few weeks ago involving primarily blurry vision. He noted his right eye was unable to look to the right. He was initially seen in the emergency department where MRI did not reveal any acute stroke but did find evidence of Chiari malformation and associated syrinx. Subsequent to this, patient noted onset of left-sided facial numbness as well as worsening left-sided arm and leg weakness. He was again seen in the emergency department where MRI of the brain and cervical spine was completed again demonstrating Chiari malformation with large cervical thoracic syrinx. He was referred for outpatient evaluation.  Of note, the patient denies any significant medical history including hypertension, diabetes, heart disease, or lung, liver, kidney disease. No cancer history. Prior surgeries include appendectomy and tonsillectomy. He is not on any blood thinners or antiplatelet agents.   Past Medical History   Past Medical History:  Diagnosis Date   Concussion without loss of consciousness 02/06/2013   Neuromuscular disorder (HCC) 03/09/2023   Hx -facial numbness and left arm numbness   Syrinx of spinal cord (HCC) 03/09/2023    Past Surgical History   Past Surgical History:  Procedure Laterality Date   ADENOIDECTOMY     LAPAROSCOPIC APPENDECTOMY N/A 06/11/2014   Procedure: APPENDECTOMY LAPAROSCOPIC;  Surgeon: Atilano Ina, MD;  Location: [facility] OR;  Service: General;  Laterality: N/A;   TONSILLECTOMY      Social History   Social History   Tobacco Use   Smoking status: Light Smoker    Types: Cigars   Smokeless tobacco: Never  Vaping Use   Vaping status: Never Used  Substance Use Topics    Alcohol use: Yes    Comment: occasional   Drug use: Yes    Types: Marijuana    Comment: pt states uses it "twice a  day everyday" , patient instucted to withhold 24-48 hours prior to anesthesia    Medications   Prior to Admission medications   Medication Sig Start Date End Date Taking? Authorizing Provider  acetaminophen (TYLENOL) 500 MG tablet Take 1,000 mg by mouth every 6 (six) hours as needed for moderate pain (pain score 4-6).   Yes [provider]  ibuprofen (ADVIL) 200 MG tablet Take 400 mg by mouth every 6 (six) hours as needed for moderate pain (pain score 4-6).   Yes [provider]    Allergies  No Known Allergies  Review of Systems  ROS  Neurologic Exam  Awake, alert, oriented Memory and concentration grossly intact Speech fluent, appropriate CN grossly intact Motor exam: Upper Extremities Deltoid Bicep Tricep Grip  Right 5/5 5/5 5/5 5/5  Left 5/5 5/5 5/5 5/5   Lower Extremities IP Quad PF DF EHL  Right 5/5 5/5 5/5 5/5 5/5  Left 5/5 5/5 5/5 5/5 5/5   Sensation grossly intact to LT  Imaging  MRI reveals Chiari malfmoration with significant tonsillar decent, crowding of the foramen, and associated cervical syringomyelia and superior extension with syringobulbia.  Impression  - 27 y.o. male with symptomatic Chiari malformation and associated syringobulbia/syringomyelia.  Plan  - Will proceed with Chiari decompression.   I have reviewed the indications for the procedure as well as the details of the procedure and the expected postoperative course and  recovery at length with the patient in the office. We have also reviewed in detail the risks, benefits, and alternatives to the procedure. All questions were answered and Brayen L Lacombe provided informed consent to proceed.  Lisbeth Renshaw, MD [facility]

## 2023-04-06 NOTE — Anesthesia Postprocedure Evaluation (Signed)
Anesthesia Post Note  Patient: Todd Mcintyre  Procedure(s) Performed: CHIARI DECOMPRESSION     Patient location during evaluation: PACU Anesthesia Type: General Level of consciousness: awake Pain management: pain level controlled Vital Signs Assessment: post-procedure vital signs reviewed and stable Respiratory status: spontaneous breathing, nonlabored ventilation, respiratory function stable and patient connected to nasal cannula oxygen Cardiovascular status: blood pressure returned to baseline and stable Postop Assessment: no apparent nausea or vomiting Anesthetic complications: no   There were no known notable events for this encounter.  Last Vitals:  Vitals:   04/06/23 1530 04/06/23 1545  BP: (!) 143/88 (!) 142/87  Pulse: 88 77  Resp: 12 20  Temp:    SpO2: 95% 97%    Last Pain:  Vitals:   04/06/23 1545  PainSc: Asleep                 Mariann Barter

## 2023-04-06 NOTE — Anesthesia Procedure Notes (Signed)
Arterial Line Insertion Start/End11/04/2023 11:14 AM, 04/06/2023 11:20 AM Performed by: Heron Sabins, CRNA, CRNA  Patient location: OR. Preanesthetic checklist: patient identified, IV checked, site marked, risks and benefits discussed, surgical consent, monitors and equipment checked, pre-op evaluation, timeout performed and anesthesia consent Patient sedated Left, radial was placed Catheter size: 22 G Hand hygiene performed  and maximum sterile barriers used  Allen's test indicative of satisfactory collateral circulation Attempts: 1 Procedure performed without using ultrasound guided technique. Following insertion, dressing applied and Biopatch. Post procedure assessment: normal  Patient tolerated the procedure well with no immediate complications.

## 2023-04-07 ENCOUNTER — Encounter (HOSPITAL_COMMUNITY): Payer: Self-pay | Admitting: Neurosurgery

## 2023-04-07 DIAGNOSIS — G935 Compression of brain: Secondary | ICD-10-CM | POA: Diagnosis not present

## 2023-04-07 LAB — BASIC METABOLIC PANEL
Anion gap: 11 (ref 5–15)
BUN: 10 mg/dL (ref 6–20)
CO2: 22 mmol/L (ref 22–32)
Calcium: 9.4 mg/dL (ref 8.9–10.3)
Chloride: 102 mmol/L (ref 98–111)
Creatinine, Ser: 1.09 mg/dL (ref 0.61–1.24)
GFR, Estimated: >60 mL/min (ref >60)
Glucose, Bld: 153 mg/dL — ABNORMAL HIGH (ref 70–99)
Potassium: 3.5 mmol/L (ref 3.5–5.1)
Sodium: 135 mmol/L (ref 135–145)

## 2023-04-07 LAB — CBC WITH DIFFERENTIAL/PLATELET
Abs Immature Granulocytes: 0.05 10*3/uL (ref 0.00–0.07)
Basophils Absolute: 0 10*3/uL (ref 0.0–0.1)
Basophils Relative: 0 %
Eosinophils Absolute: 0 10*3/uL (ref 0.0–0.5)
Eosinophils Relative: 0 %
HCT: 43.6 % (ref 39.0–52.0)
Hemoglobin: 14.1 g/dL (ref 13.0–17.0)
Immature Granulocytes: 0 %
Lymphocytes Relative: 8 %
Lymphs Abs: 1.2 10*3/uL (ref 0.7–4.0)
MCH: 27.9 pg (ref 26.0–34.0)
MCHC: 32.3 g/dL (ref 30.0–36.0)
MCV: 86.2 fL (ref 80.0–100.0)
Monocytes Absolute: 1.1 10*3/uL — ABNORMAL HIGH (ref 0.1–1.0)
Monocytes Relative: 8 %
Neutro Abs: 11.7 10*3/uL — ABNORMAL HIGH (ref 1.7–7.7)
Neutrophils Relative %: 84 %
Platelets: 200 10*3/uL (ref 150–400)
RBC: 5.06 MIL/uL (ref 4.22–5.81)
RDW: 11.7 % (ref 11.5–15.5)
WBC: 14 10*3/uL — ABNORMAL HIGH (ref 4.0–10.5)
nRBC: 0 % (ref 0.0–0.2)

## 2023-04-07 LAB — MAGNESIUM: Magnesium: 1.7 mg/dL (ref 1.7–2.4)

## 2023-04-07 LAB — PHOSPHORUS: Phosphorus: 3.5 mg/dL (ref 2.5–4.6)

## 2023-04-07 MED ORDER — OXYCODONE HCL 5 MG PO TABS
5.0000 mg | ORAL_TABLET | ORAL | Status: DC | PRN
  Administered 2023-04-07 – 2023-04-09 (×10): 10 mg via ORAL
  Filled 2023-04-07 (×10): qty 2

## 2023-04-07 MED ORDER — PANTOPRAZOLE SODIUM 40 MG PO TBEC
40.0000 mg | DELAYED_RELEASE_TABLET | Freq: Every day | ORAL | Status: DC
  Administered 2023-04-08 – 2023-04-09 (×2): 40 mg via ORAL
  Filled 2023-04-07 (×2): qty 1

## 2023-04-07 NOTE — Progress Notes (Signed)
  NEUROSURGERY PROGRESS NOTE   Pt seen and examined. No issues overnight. Pt with appropriate neck soreness. Has noted improvement in facial sensation/droop and subjective improvement in diplopia. Has been up to bathroom.  EXAM: Temp:  [98.4 F (36.9 C)-99 F (37.2 C)] 98.5 F (36.9 C) (11/13 1556) Pulse Rate:  [60-94] 70 (11/13 1700) Resp:  [16-23] 20 (11/13 1700) BP: (124-160)/(75-103) 160/99 (11/13 1700) SpO2:  [92 %-100 %] 97 % (11/13 1700) Intake/Output      11/12 0701 11/13 0700 11/13 0701 11/14 0700   P.O.  25   I.V. (mL/kg) 1150 (11)    IV Piggyback 100 100   Total Intake(mL/kg) 1250 (12) 125 (1.2)   Urine (mL/kg/hr) 1725    Blood 450    Total Output 2175    Net -925 +125        Urine Occurrence 3 x 1 x    Awake, alert, oriented Speech fluent Mild left facial droop and left CN VI palsy MAE well Wound c/d/i  LABS: Lab Results  Component Value Date   CREATININE 1.09 04/07/2023   BUN 10 04/07/2023   NA 135 04/07/2023   K 3.5 04/07/2023   CL 102 04/07/2023   CO2 22 04/07/2023   Lab Results  Component Value Date   WBC 14.0 (H) 04/07/2023   HGB 14.1 04/07/2023   HCT 43.6 04/07/2023   MCV 86.2 04/07/2023   PLT 200 04/07/2023     IMPRESSION: - 27 y.o. male POD#1 s/p Chiari decompression with large cervicothoracic syringomyelia/syringobulbia. Subjectively improved from preop.  PLAN: - Cont supportive care - Will get PT/OT - Increase Oxy to 10mg  q4 Hrs PRN and keep scheduled Valium   Lisbeth Renshaw, MD [facility]

## 2023-04-07 NOTE — Progress Notes (Signed)
NAME:  Todd Mcintyre, MRN:  [id number], DOB:  Aug 12, 1995, LOS: 1 ADMISSION DATE:  04/06/2023, CONSULTATION DATE: 04/06/2023 REFERRING MD: Dr. Conchita Paris,, CHIEF COMPLAINT: Status post decompression of Arnold-Chiari malformation  History of Present Illness:  He is s/p Arnold-Chiari malformation repair He had recently been seen in the emergency department for right-sided facial droop, right-sided weakness, blurry vision -MRI at the time did note a syrinx.  There was some progression of his symptoms and was seen again with a repeat MRI did show a large cervical thoracic syrinx Schedule for surgery and surgery was performed 04/06/2023  Pertinent  Medical History  Has no chronic ongoing medical problems outside his recent presentation  Significant Hospital Events: Including procedures, antibiotic start and stop dates in addition to other pertinent events   MRI-IMPRESSION: 1. Extensive spinal cord syrinx extending from the brainstem through the cervical and imaged thoracic spinal cord, measuring up to 8 x 16 mm in the axial plane. 2. Extension of the cerebellar tonsils through the foramen magnum is better evaluated on the same day MRI head. 3. C3-C4 mild-to-moderate left neural foraminal narrowing. No spinal canal stenosis.  Interim History / Subjective:  No overnight issues, this morning complaining of pain around surgical site Antigravity in all 4 extremities but weaker on left side, facial droop has resolved  Objective   Blood pressure 127/81, pulse 63, temperature 98.7 F (37.1 C), temperature source Oral, resp. rate 17, height 6' (1.829 m), weight 104.3 kg, SpO2 97%.        Intake/Output Summary (Last 24 hours) at 04/07/2023 0820 Last data filed at 04/07/2023 0556 Gross per 24 hour  Intake 1250 ml  Output 2175 ml  Net -925 ml   Filed Weights   04/02/23 1613 04/06/23 1014  Weight: 104.3 kg 104.3 kg  Examination: General: Young African-American male, lying on the  bed HEENT: [place]/AT, eyes anicteric.  moist mucus membranes Neuro: Alert, awake following commands, antigravity in all 4 extremities, slightly weaker on left side.  No facial droop, face is symmetric Chest: Coarse breath sounds, no wheezes or rhonchi Heart: Regular rate and rhythm, no murmurs or gallops Abdomen: Soft, nontender, nondistended, bowel sounds present Skin: No rash  Labs pending Resolved Hospital Problem list     Assessment & Plan:  S/p Arnold-Chiari malformation repair postop day 1 s/p Suboccipital craniectomy with expansile duraplasty with C1 laminectomy Continue neuro watch Patient's neurological symptoms are getting better, he continued to have slight left-sided weakness Speech is clear, no facial droop Remained hemodynamically stable Follow-up labs Continue pain management with Tylenol, oxycodone and Dilaudid Continue Valium for spasm   Best Practice (right click and "Reselect all SmartList Selections" daily)   Diet/type: Regular consistency (see orders) DVT prophylaxis: SCD GI prophylaxis: PPI Lines: N/A Foley:  N/A Code Status:  full code Last date of multidisciplinary goals of care discussion [patient fully alert and interactive]  Labs   CBC: No results for input(s): "WBC", "NEUTROABS", "HGB", "HCT", "MCV", "PLT" in the last 168 hours.  Basic Metabolic Panel: No results for input(s): "NA", "K", "CL", "CO2", "GLUCOSE", "BUN", "CREATININE", "CALCIUM", "MG", "PHOS" in the last 168 hours. GFR: CrCl cannot be calculated (Patient's most recent lab result is older than the maximum 21 days allowed.). No results for input(s): "PROCALCITON", "WBC", "LATICACIDVEN" in the last 168 hours.  Liver Function Tests: No results for input(s): "AST", "ALT", "ALKPHOS", "BILITOT", "PROT", "ALBUMIN" in the last 168 hours. No results for input(s): "LIPASE", "AMYLASE" in the last 168 hours. No results for  input(s): "AMMONIA" in the last 168 hours.  ABG    Component Value  Date/Time   TCO2 26 03/18/2014 0238     Coagulation Profile: No results for input(s): "INR", "PROTIME" in the last 168 hours.  Cardiac Enzymes: No results for input(s): "CKTOTAL", "CKMB", "CKMBINDEX", "TROPONINI" in the last 168 hours.  HbA1C: No results found for: "HGBA1C"  CBG: No results for input(s): "GLUCAP" in the last 168 hours.     Cheri Fowler, MD [place] [facility] See Amion for pager If no response to pager, please call [phone] until 7pm After 7pm, Please call E-link [phone]

## 2023-04-07 NOTE — Plan of Care (Signed)
  Problem: Education: Goal: Knowledge of the prescribed therapeutic regimen will improve Outcome: Progressing   Problem: Clinical Measurements: Goal: Usual level of consciousness will be regained or maintained. Outcome: Progressing Goal: Neurologic status will improve Outcome: Progressing Goal: Ability to maintain intracranial pressure will improve Outcome: Progressing   Problem: Skin Integrity: Goal: Demonstration of wound healing without infection will improve Outcome: Progressing

## 2023-04-08 DIAGNOSIS — G935 Compression of brain: Secondary | ICD-10-CM | POA: Diagnosis not present

## 2023-04-08 MED ORDER — POTASSIUM CHLORIDE CRYS ER 20 MEQ PO TBCR
40.0000 meq | EXTENDED_RELEASE_TABLET | Freq: Once | ORAL | Status: AC
  Administered 2023-04-08: 40 meq via ORAL
  Filled 2023-04-08: qty 2

## 2023-04-08 MED ORDER — MAGNESIUM OXIDE -MG SUPPLEMENT 400 (240 MG) MG PO TABS
800.0000 mg | ORAL_TABLET | Freq: Once | ORAL | Status: AC
  Administered 2023-04-08: 800 mg via ORAL
  Filled 2023-04-08: qty 2

## 2023-04-08 NOTE — Progress Notes (Signed)
NAME:  Todd Mcintyre, MRN:  [id number], DOB:  [birth year], LOS: 2 ADMISSION DATE:  04/06/2023, CONSULTATION DATE: 04/06/2023 REFERRING MD: Dr. Conchita Paris,, CHIEF COMPLAINT: Status post decompression of Arnold-Chiari malformation  History of Present Illness:  He is s/p Arnold-Chiari malformation repair He had recently been seen in the emergency department for right-sided facial droop, right-sided weakness, blurry vision -MRI at the time did note a syrinx.  There was some progression of his symptoms and was seen again with a repeat MRI did show a large cervical thoracic syrinx Schedule for surgery and surgery was performed 04/06/2023  Pertinent  Medical History  Has no chronic ongoing medical problems outside his recent presentation  Significant Hospital Events: Including procedures, antibiotic start and stop dates in addition to other pertinent events   MRI-IMPRESSION: 1. Extensive spinal cord syrinx extending from the brainstem through the cervical and imaged thoracic spinal cord, measuring up to 8 x 16 mm in the axial plane. 2. Extension of the cerebellar tonsils through the foramen magnum is better evaluated on the same day MRI head. 3. C3-C4 mild-to-moderate left neural foraminal narrowing. No spinal canal stenosis.  Interim History / Subjective:  Remain afebrile No overnight issues Stated headache and neck pain is better 2/10 Continue to complain of some blurry vision but it is much improved since surgery Still has some left facial numbness  Objective   Blood pressure (!) 136/91, pulse 76, temperature 99.2 F (37.3 C), temperature source Oral, resp. rate 19, height 6' (1.829 m), weight 104.3 kg, SpO2 96%.        Intake/Output Summary (Last 24 hours) at 04/08/2023 0813 Last data filed at 04/08/2023 0000 Gross per 24 hour  Intake 25 ml  Output 600 ml  Net -575 ml   Filed Weights   04/02/23 1613 04/06/23 1014  Weight: 104.3 kg 104.3 kg  Examination: General: Young  male, lying on the bed HEENT: [place]/AT, eyes anicteric.  moist mucus membranes Neuro: Alert, awake following commands.  Face is symmetric, tongue and uvula midline, slight drift noted in left upper extremity otherwise 5/5 Chest: Coarse breath sounds, no wheezes or rhonchi Heart: Regular rate and rhythm, no murmurs or gallops Abdomen: Soft, nontender, nondistended, bowel sounds present Skin: No rash  Labs and images reviewed  Resolved Hospital Problem list     Assessment & Plan:  S/p Arnold-Chiari malformation repair postop day 1 s/p Suboccipital craniectomy with expansile duraplasty with C1 laminectomy Continue neuro watch Patient's neurological symptoms are getting better, he continued to have slight left upper extremity drift Facial droop has resolved, speech is clear Remained hemodynamically stable Continue pain management with Tylenol, oxycodone and Dilaudid Continue Valium for spasm PT/OT evaluation   Best Practice (right click and "Reselect all SmartList Selections" daily)   Diet/type: Regular consistency (see orders) DVT prophylaxis: SCD GI prophylaxis: PPI Lines: N/A Foley:  N/A Code Status:  full code Last date of multidisciplinary goals of care discussion [patient fully alert and interactive]  Labs   CBC: Recent Labs  Lab 04/07/23 1014  WBC 14.0*  NEUTROABS 11.7*  HGB 14.1  HCT 43.6  MCV 86.2  PLT 200    Basic Metabolic Panel: Recent Labs  Lab 04/07/23 1014  NA 135  K 3.5  CL 102  CO2 22  GLUCOSE 153*  BUN 10  CREATININE 1.09  CALCIUM 9.4  MG 1.7  PHOS 3.5   GFR: Estimated Creatinine Clearance: 128.3 mL/min (by C-G formula based on SCr of 1.09 mg/dL). Recent Labs  Lab  04/07/23 1014  WBC 14.0*    Liver Function Tests: No results for input(s): "AST", "ALT", "ALKPHOS", "BILITOT", "PROT", "ALBUMIN" in the last 168 hours. No results for input(s): "LIPASE", "AMYLASE" in the last 168 hours. No results for input(s): "AMMONIA" in the last 168  hours.  ABG    Component Value Date/Time   TCO2 26 03/18/2014 0238     Coagulation Profile: No results for input(s): "INR", "PROTIME" in the last 168 hours.  Cardiac Enzymes: No results for input(s): "CKTOTAL", "CKMB", "CKMBINDEX", "TROPONINI" in the last 168 hours.  HbA1C: No results found for: "HGBA1C"  CBG: No results for input(s): "GLUCAP" in the last 168 hours.     Cheri Fowler, MD [facility] See Amion for pager If no response to pager, please call [phone] until 7pm After 7pm, Please call E-link [phone]

## 2023-04-08 NOTE — Evaluation (Signed)
Physical Therapy Evaluation Patient Details Name: Todd Mcintyre MRN: [id number] DOB: 1995-12-10 Today's Date: 04/08/2023  History of Present Illness  Pt is 27 yo male who presents on 04/06/23 for Arnold-Chiari malformation repair (suboccipital craniectomy with expansile duraplasty with C1 laminectomy). PMH: appendectomy, tonsillectomy, several ED visits for facial numbness and L side numbness  Clinical Impression  Pt admitted with above diagnosis. Pt from home with girlfriend and children. 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 Pt has diplopia that is corrected with R eye patch but still relays light headedness with mobility. Pt with distal>proximal LLE and LUE weakness with decreased df and foot clearance during gait. He also had LOB to R with self correction and bumped a stationary object on his R. Pt reports L facial numbness unchanged since before surgery. Would benefit from another session of PT tomorrow before d/c and recommend outpt neuro PT at d/c. BP 152/100 with mobility, HR up to 108 bpm, SPO2 in 90's on RA.  Pt currently with functional limitations due to the deficits listed below (see PT Problem List). Pt will benefit from acute skilled PT to increase their independence and safety with mobility to allow discharge.           If plan is discharge home, recommend the following: A little help with walking and/or transfers;A little help with bathing/dressing/bathroom;Assistance with cooking/housework;Assist for transportation   Can travel by private vehicle        Equipment Recommendations None recommended by PT  Recommendations for Other Services  OT consult    Functional Status Assessment Patient has had a recent decline in their functional status and demonstrates the ability to make significant improvements in function in a reasonable and predictable amount of time.     Precautions / Restrictions  Precautions Precautions: Fall Precaution Comments: has had dizziness, no falls, but reports knocking into walls Restrictions Weight Bearing Restrictions: No      Mobility  Bed Mobility Overal bed mobility: Modified Independent             General bed mobility comments: increased effort to come to EOB, esp L side, but no physical assist needed    Transfers Overall transfer level: Needs assistance Equipment used: None Transfers: Sit to/from Stand, Bed to chair/wheelchair/BSC Sit to Stand: Supervision   Step pivot transfers: Supervision       General transfer comment: pt donned eye patch for mobility, relays mild light headedness with sit to stand    Ambulation/Gait Ambulation/Gait assistance: Contact guard assist Gait Distance (Feet): 200 Feet Assistive device: None Gait Pattern/deviations: Step-through pattern, Decreased dorsiflexion - left, Decreased weight shift to left Gait velocity: decreased Gait velocity interpretation: 1.31 - 2.62 ft/sec, indicative of limited community ambulator   General Gait Details: decreased df LLE, worsened with distance. Bumped stationary object on R and had one LOB to R with self correction. Low step height LLE and pt notes that LLE feels heavy  Stairs Stairs: Yes Stairs assistance: Contact guard assist Stair Management: One rail Left, Alternating pattern, Step to pattern, Forwards Number of Stairs: 10 General stair comments: able to use alternating pattern with ascent and noted that stairs felt easier than level ground gait. However with descent had difficulty stepping L foot off stair and predominately used step to pattern leading with L.  Wheelchair Mobility     Tilt Bed    Modified Rankin (Stroke Patients Only)       Balance  Overall balance assessment: Needs assistance Sitting-balance support: No upper extremity supported, Feet supported Sitting balance-Leahy Scale: Good     Standing balance support: No upper  extremity supported, During functional activity Standing balance-Leahy Scale: Fair Standing balance comment: accepts challege with stepping strategy                             Pertinent Vitals/Pain Pain Assessment Pain Assessment: Faces Faces Pain Scale: Hurts little more Pain Location: head/ neck Pain Descriptors / Indicators: Sore Pain Intervention(s): Patient requesting pain meds-RN notified, RN gave pain meds during session, Limited activity within patient's tolerance, Monitored during session    Home Living Family/patient expects to be discharged to:: Private residence Living Arrangements: Spouse/significant other;Children Available Help at Discharge: Family;Available PRN/intermittently Type of Home: Other(Comment) (townhouse) Home Access: Level entry     Alternate Level Stairs-Number of Steps: flight Home Layout: Two level;1/2 bath on main level;Bed/bath upstairs Home Equipment: None Additional Comments: lives with girlfriend and 2 young kids    Prior Function Prior Level of Function : Independent/Modified Independent;Working/employed;Driving             Mobility 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  Has still been driving with patch on R eye. ADLs Comments: independent     Extremity/Trunk Assessment   Upper Extremity Assessment Upper Extremity Assessment: Defer to OT evaluation    Lower Extremity Assessment Lower Extremity Assessment: LLE deficits/detail LLE Deficits / Details: hip flex 4+/5, knee ext 4/5, ankle df 4-/5 (compared to 5/5 strength on R) LLE Sensation: decreased proprioception LLE Coordination: decreased gross motor    Cervical / Trunk Assessment Cervical / Trunk Assessment: Normal  Communication   Communication Communication: No apparent difficulties Cueing Techniques: Verbal cues  Cognition Arousal: Alert Behavior During Therapy: WFL for tasks assessed/performed Overall  Cognitive Status: Within Functional Limits for tasks assessed                                          General Comments General comments (skin integrity, edema, etc.): BP 152/100, HR 108 bpm with gait and stairs, SPO2 in 90's on RA. Girlfriend present. Eye patch on R eliminates double vision. Pt c/o facial numbness L side of face.    Exercises     Assessment/Plan    PT Assessment Patient needs continued PT services  PT Problem List Decreased strength;Decreased activity tolerance;Decreased balance;Decreased mobility;Decreased coordination;Decreased knowledge of precautions;Pain;Impaired sensation       PT Treatment Interventions Gait training;Stair training;Functional mobility training;Therapeutic activities;Therapeutic exercise;Balance training;Neuromuscular re-education;Patient/family education    PT Goals (Current goals can be found in the Care Plan section)  Acute Rehab PT Goals Patient Stated Goal: return to home and work PT Goal Formulation: With patient Time For Goal Achievement: 04/22/23 Potential to Achieve Goals: Good    Frequency Min 1X/week     Co-evaluation               AM-PAC PT "6 Clicks" Mobility  Outcome Measure Help needed turning from your back to your side while in a flat bed without using bedrails?: None Help needed moving from lying on your back to sitting on the side of a flat bed without using bedrails?: None Help needed moving to and from a bed to a chair (including a wheelchair)?: A  Little Help needed standing up from a chair using your arms (e.g., wheelchair or bedside chair)?: A Little Help needed to walk in hospital room?: A Little Help needed climbing 3-5 steps with a railing? : A Little 6 Click Score: 20    End of Session Equipment Utilized During Treatment: Gait belt Activity Tolerance: Patient tolerated treatment well Patient left: in chair;with call bell/phone within reach;with family/visitor present Nurse  Communication: Mobility status PT Visit Diagnosis: Unsteadiness on feet (R26.81);Ataxic gait (R26.0);Pain Pain - part of body:  (neck)    Time: 7829-5621 PT Time Calculation (min) (ACUTE ONLY): 43 min   Charges:     PT Treatments $Gait Training: 8-22 mins $Therapeutic Activity: 8-22 mins PT General Charges $$ ACUTE PT VISIT: 1 Visit         Lyanne Co, PT  Acute Rehab Services Secure chat preferred Office [phone]   Lawana Chambers Nabor Thomann 04/08/2023, 12:14 PM

## 2023-04-08 NOTE — Progress Notes (Signed)
Pt transferred from ICU to 4NP12. A&O x4 and 3/10 pain in neck + head. Vitals recorded and assessment completed. Pt sitting in chair with call bell within reach. Oriented to standard equipment and staff.

## 2023-04-08 NOTE — Evaluation (Signed)
Occupational Therapy Evaluation Patient Details Name: Todd Mcintyre MRN: [id number] DOB: [birth year] Today's Date: 04/08/2023   History of Present Illness Pt is 27 yo male who presents on 04/06/23 for Arnold-Chiari malformation repair (suboccipital craniectomy with expansile duraplasty with C1 laminectomy). PMH: appendectomy, tonsillectomy, several ED visits for facial numbness and L side numbness   Clinical Impression   PTA pt lives independently with his girlfriend and 2 small children. 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 Pt complains of horizontal diplopia is distant and R gaze. Partial occlusion used over nasal portion of R lens to reduce double image and promote use of binocular vision for functional tasks and mobility. Pt reports improvement with use of occlusion.  Pt with incoordination L hand due to sensory deficits. Will follow up tomorrow to give written information on management of partial occlusion and establish a HEP for L hand. Recommend follow up with OT at a neuro outpt center. Pt very appreciative.       If plan is discharge home, recommend the following: A little help with walking and/or transfers;Assistance with cooking/housework;Assist for transportation    Functional Status Assessment  Patient has had a recent decline in their functional status and demonstrates the ability to make significant improvements in function in a reasonable and predictable amount of time.  Equipment Recommendations  None recommended by OT    Recommendations for Other Services       Precautions / Restrictions Precautions Precautions: Fall Precaution Comments: has had dizziness, no falls, but reports knocking into walls; diplopia Restrictions Weight Bearing Restrictions: No      Mobility Bed Mobility Overal bed mobility: Modified Independent             General bed mobility comments: increased effort to  come to EOB, esp L side, but no physical assist needed    Transfers Overall transfer level: Needs assistance Equipment used: None Transfers: Sit to/from Stand, Bed to chair/wheelchair/BSC Sit to Stand: Supervision     Step pivot transfers: Supervision     General transfer comment: pt donned eye patch for mobility, relays mild light headedness with sit to stand      Balance Overall balance assessment: Needs assistance Sitting-balance support: No upper extremity supported, Feet supported Sitting balance-Leahy Scale: Good     Standing balance support: No upper extremity supported, During functional activity Standing balance-Leahy Scale: Fair                             ADL either performed or assessed with clinical judgement   ADL Overall ADL's : At baseline                                       General ADL Comments: increased time however able ot complete bathing/dressing; increased difficulty with opening containers     Vision Baseline Vision/History: 0 No visual deficits Vision Assessment?: Yes Eye Alignment: Impaired (comment) Ocular Range of Motion: Restricted on the right Alignment/Gaze Preference: Within Defined Limits Tracking/Visual Pursuits: Right eye does not track laterally Saccades: Additional head turns occurred during testing Convergence: Within functional limits Visual Fields: No apparent deficits Diplopia Assessment: Objects split side to side;Present in far gaze;Only with right gaze     Perception         Praxis  Pertinent Vitals/Pain Pain Assessment Pain Assessment: Faces Faces Pain Scale: Hurts whole lot Pain Location: head/ neck Pain Descriptors / Indicators: Sore Pain Intervention(s): Limited activity within patient's tolerance, Patient requesting pain meds-RN notified     Extremity/Trunk Assessment Upper Extremity Assessment Upper Extremity Assessment: Right hand dominant;LUE deficits/detail LUE  Deficits / Details: AROM and strength overall WFL; incoordination L hand due to impared sensation LUE Sensation: decreased light touch;decreased proprioception LUE Coordination: decreased fine motor   Lower Extremity Assessment Lower Extremity Assessment: Defer to PT evaluation   Cervical / Trunk Assessment Cervical / Trunk Assessment: Normal   Communication Communication Communication: No apparent difficulties   Cognition Arousal: Alert Behavior During Therapy: WFL for tasks assessed/performed Overall Cognitive Status: Within Functional Limits for tasks assessed                                       General Comments       Exercises     Shoulder Instructions      Home Living Family/patient expects to be discharged to:: Private residence Living Arrangements: Spouse/significant other;Children Available Help at Discharge: Family;Available PRN/intermittently Type of Home: Other(Comment) (townhouse) Home Access: Level entry     Home Layout: Two level;1/2 bath on main level;Bed/bath upstairs Alternate Level Stairs-Number of Steps: flight Alternate Level Stairs-Rails: Left Bathroom Shower/Tub: Chief Strategy Officer: Standard     Home Equipment: None   Additional Comments: lives with girlfriend and 2 young kids      Prior Functioning/Environment Prior Level of Function : Independent/Modified Independent;Working/employed;Driving             Mobility 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  Has still been driving with patch on R eye. ADLs Comments: independent        OT Problem List: Impaired vision/perception;Decreased coordination;Pain      OT Treatment/Interventions: Self-care/ADL training;Therapeutic exercise;Neuromuscular education;Therapeutic activities;Visual/perceptual remediation/compensation;Patient/family education;Balance training    OT Goals(Current goals can be found in the  care plan section) Acute Rehab OT Goals Patient Stated Goal: to get better OT Goal Formulation: With patient Time For Goal Achievement: 04/22/23 Potential to Achieve Goals: Good  OT Frequency: Min 1X/week    Co-evaluation              AM-PAC OT "6 Clicks" Daily Activity     Outcome Measure Help from another person eating meals?: None Help from another person taking care of personal grooming?: None Help from another person toileting, which includes using toliet, bedpan, or urinal?: None Help from another person bathing (including washing, rinsing, drying)?: None Help from another person to put on and taking off regular upper body clothing?: None Help from another person to put on and taking off regular lower body clothing?: None 6 Click Score: 24   End of Session Nurse Communication: Other (comment) (use of partial occlusion)  Activity Tolerance: Patient tolerated treatment well Patient left: in bed;with call bell/phone within reach;with family/visitor present  OT Visit Diagnosis: Unsteadiness on feet (R26.81);Other (comment) (diplopia)                Time: 4098-1191 OT Time Calculation (min): 18 min Charges:  OT General Charges $OT Visit: 1 Visit OT Evaluation $OT Eval Moderate Complexity: 1 Mod  Giovany Cosby, OT/L   Acute OT Clinical Specialist Acute Rehabilitation Services Pager [phone] Office [phone]  Genevive Printup,H[place]ARY 04/08/2023, 6:17 PM

## 2023-04-08 NOTE — Progress Notes (Signed)
  NEUROSURGERY PROGRESS NOTE   Pt seen and examined. No issues overnight. Pain improved with 10mg  oxycodone. Ambulating well with PT/OT.  EXAM: Temp:  [98.5 F (36.9 C)-100 F (37.8 C)] 98.5 F (36.9 C) (11/14 1200) Pulse Rate:  [67-108] 83 (11/14 1500) Resp:  [16-27] 16 (11/14 1500) BP: (133-160)/(81-111) 139/87 (11/14 1500) SpO2:  [93 %-100 %] 100 % (11/14 1500) Intake/Output      11/13 0701 11/14 0700 11/14 0701 11/15 0700   P.O. 25 240   I.V. (mL/kg)     IV Piggyback 100    Total Intake(mL/kg) 125 (1.2) 240 (2.3)   Urine (mL/kg/hr) 600 (0.2)    Blood     Total Output 600    Net -475 +240        Urine Occurrence 5 x 2 x    Awake, alert, oriented Speech fluent Left facial droop improved, still with mild left facial numbness and right partial ophthalmoplegia MAE well Wound c/d/i  LABS: Lab Results  Component Value Date   CREATININE 1.09 04/07/2023   BUN 10 04/07/2023   NA 135 04/07/2023   K 3.5 04/07/2023   CL 102 04/07/2023   CO2 22 04/07/2023   Lab Results  Component Value Date   WBC 14.0 (H) 04/07/2023   HGB 14.1 04/07/2023   HCT 43.6 04/07/2023   MCV 86.2 04/07/2023   PLT 200 04/07/2023     IMPRESSION: - 27 y.o. male POD#2 s/p Chiari decompression with large cervicothoracic syringomyelia/syringobulbia. Subjectively improved from preop.  PLAN: - Cont supportive care - Cont Pt/OT - Possible d/c home tomorrow  Lisbeth Renshaw, MD [facility]

## 2023-04-09 MED ORDER — OXYCODONE HCL 10 MG PO TABS: 10.0000 mg | ORAL_TABLET | ORAL | 0 refills | Status: AC | PRN

## 2023-04-09 NOTE — Progress Notes (Signed)
Physical Therapy Treatment Patient Details Name: Todd Mcintyre MRN: [id number] DOB: [birth year] Today's Date: 04/09/2023   History of Present Illness Pt is 27 yo male who presents on 04/06/23 for Arnold-Chiari malformation repair (suboccipital craniectomy with expansile duraplasty with C1 laminectomy). PMH: appendectomy, tonsillectomy, several ED visits for facial numbness and L side numbness    PT Comments  Patient progressing walking in and out of bathroom on incline and able to transition with minor catching L foot.  Able to walk on heel on L using wall rail and full squats with rail support.  Noted more confident on stairs this session and managing hallway ambulation with some veering though no true LOB.  Continue to recommend outpatient PT at neurorehab.  Will follow in acute setting until d/c.     If plan is discharge home, recommend the following: Assistance with cooking/housework;Assist for transportation   Can travel by private vehicle        Equipment Recommendations  None recommended by PT    Recommendations for Other Services       Precautions / Restrictions Precautions Precautions: Fall Precaution Comments: has had dizziness, no falls, but reports knocking into walls; diplopia     Mobility  Bed Mobility Overal bed mobility: Modified Independent             General bed mobility comments: slow to rise, sitting straight up    Transfers   Equipment used: None Transfers: Sit to/from Stand Sit to Stand: Supervision           General transfer comment: mild unsteadiness initial standing, but no physical help; asking for taped glasses to walk    Ambulation/Gait Ambulation/Gait assistance: Supervision Gait Distance (Feet): 250 Feet Assistive device: None Gait Pattern/deviations: Step-through pattern, Step-to pattern, Decreased stride length, Shuffle, Decreased dorsiflexion - left, Wide base of support, Drifts right/left       General Gait Details:  at times catching L foot esp with into  bathroom with slight incline, but no LOB or grabbing items to stop fall.  In hallway with some veering, though no LOB, pt noted when walking with legs/feet turned out feels more stable.   Stairs Stairs: Yes Stairs assistance: Supervision, Contact guard assist Stair Management: One rail Left, Alternating pattern, Step to pattern, Forwards Number of Stairs: 5 General stair comments: alternating to ascend and step to to descend leading down with L first   Wheelchair Mobility     Tilt Bed    Modified Rankin (Stroke Patients Only)       Balance Overall balance assessment: Needs assistance   Sitting balance-Leahy Scale: Good     Standing balance support: During functional activity, No upper extremity supported Standing balance-Leahy Scale: Good Standing balance comment: walking in and out of bathroom to gather supplies to brush teeth, then to wash face with S, only one episode catching L foot and self recovery               High Level Balance Comments: standing at wall rail in hallway performed high stepping on L, backward walking, heel walking on L and full squats x 5            Cognition Arousal: Alert Behavior During Therapy: WFL for tasks assessed/performed Overall Cognitive Status: Within Functional Limits for tasks assessed  Exercises      General Comments General comments (skin integrity, edema, etc.): VSS, wearing taped glasses throughout, pt reports better with peripheral on R      Pertinent Vitals/Pain Pain Assessment Pain Assessment: 0-10 Pain Score: 7  Pain Location: reports no pain with head still, more with R head rotation Pain Descriptors / Indicators: Aching, Discomfort Pain Intervention(s): Monitored during session    Home Living                          Prior Function            PT Goals (current goals can now be found in the  care plan section) Progress towards PT goals: Progressing toward goals    Frequency    Min 1X/week      PT Plan      Co-evaluation              AM-PAC PT "6 Clicks" Mobility   Outcome Measure  Help needed turning from your back to your side while in a flat bed without using bedrails?: None Help needed moving from lying on your back to sitting on the side of a flat bed without using bedrails?: None Help needed moving to and from a bed to a chair (including a wheelchair)?: None Help needed standing up from a chair using your arms (e.g., wheelchair or bedside chair)?: None Help needed to walk in hospital room?: A Little Help needed climbing 3-5 steps with a railing? : A Little 6 Click Score: 22    End of Session Equipment Utilized During Treatment: Gait belt Activity Tolerance: Patient tolerated treatment well Patient left: in chair;with call bell/phone within reach   PT Visit Diagnosis: Ataxic gait (R26.0);Other abnormalities of gait and mobility (R26.89);Muscle weakness (generalized) (M62.81)     Time: 4098-1191 PT Time Calculation (min) (ACUTE ONLY): 23 min  Charges:    $Gait Training: 8-22 mins $Therapeutic Activity: 8-22 mins PT General Charges $$ ACUTE PT VISIT: 1 Visit                     Sheran Lawless, PT Acute Rehabilitation Services [phone] 04/09/2023    Todd Mcintyre 04/09/2023, 11:53 AM

## 2023-04-09 NOTE — Progress Notes (Signed)
Pt discharge education and instructions completed with pt. Pt denies any question or concern. Pt IV and telemetry removed; pt medication prior to discharge for pain of 7 out of 10. Pt head incision opened to air, clean, dry and intact. Unremarkable. Pt discharge home with friend to transport him home. Pt to pick up electronically sent prescription from preferred pharmacy on file. Pt transported off unit via wheelchair with belongings and friend at the side. Dionne Bucy RN

## 2023-04-09 NOTE — Progress Notes (Signed)
  NEUROSURGERY PROGRESS NOTE   Pt seen and examined. No issues overnight. Pain under control  EXAM: Temp:  [98.3 F (36.8 C)-100.3 F (37.9 C)] 98.3 F (36.8 C) (11/15 1141) Pulse Rate:  [73-102] 73 (11/15 1141) Resp:  [15-18] 18 (11/15 1141) BP: (126-143)/(80-110) 126/95 (11/15 1141) SpO2:  [97 %-100 %] 98 % (11/15 1141) Intake/Output      11/14 0701 11/15 0700 11/15 0701 11/16 0700   P.O. 240    IV Piggyback     Total Intake(mL/kg) 240 (2.3)    Urine (mL/kg/hr)     Total Output     Net +240         Urine Occurrence 2 x     Awake, alert, oriented Speech fluent Left facial droop improved, still with mild left facial numbness and right partial ophthalmoplegia MAE well Wound c/d/i  LABS: Lab Results  Component Value Date   CREATININE 1.09 04/07/2023   BUN 10 04/07/2023   NA 135 04/07/2023   K 3.5 04/07/2023   CL 102 04/07/2023   CO2 22 04/07/2023   Lab Results  Component Value Date   WBC 14.0 (H) 04/07/2023   HGB 14.1 04/07/2023   HCT 43.6 04/07/2023   MCV 86.2 04/07/2023   PLT 200 04/07/2023     IMPRESSION: - 27 y.o. male POD#3 s/p Chiari decompression with large cervicothoracic syringomyelia/syringobulbia. Doing well, improving daily  PLAN: - can d/c home today  Lisbeth Renshaw, MD [facility]

## 2023-04-09 NOTE — Plan of Care (Signed)

## 2023-04-09 NOTE — Discharge Summary (Signed)
  Physician Discharge Summary  Patient ID: MAKAIO GUNNERSON MRN: [id number] DOB/AGE: [birth year] 27 y.o.  Admit date: 04/06/2023 Discharge date: 04/09/2023  Admission Diagnoses:  Chiari malformation  Discharge Diagnoses:  Same Principal Problem:   Chiari malformation type I (HCC) Active Problems:   Chiari I malformation [facility])   Discharged Condition: Stable  Hospital Course:  Todd Mcintyre is a 27 y.o. male admitted after elective Chiari decompression.  Patient was at neurologic baseline postoperatively.  He reported progressive improvement in his cranial nerve palsies as well as his subjective gait instability.  He was seen by physical and Occupational Therapy  He requested discharge on postoperative day #3 with outpatient physical and Occupational Therapy.  Treatments: Surgery -Chiari decompression  Discharge Exam: Blood pressure (!) 126/95, pulse 73, temperature 98.3 F (36.8 C), temperature source Oral, resp. rate 18, height 6' (1.829 m), weight 104.3 kg, SpO2 98%. Awake, alert, oriented Speech fluent, appropriate CN grossly intact x left facial numbness and right partial ophthalmoplegia 5/5 BUE/BLE Wound c/d/i  Disposition: Discharge disposition: 01-Home or Self Care       Discharge Instructions     Call MD for:  redness, tenderness, or signs of infection (pain, swelling, redness, odor or green/yellow discharge around incision site)   Complete by: As directed    Call MD for:  temperature >100.4   Complete by: As directed    Diet - low sodium heart healthy   Complete by: As directed    Discharge instructions   Complete by: As directed    Walk at home as much as possible, at least 4 times / day   Increase activity slowly   Complete by: As directed    Lifting restrictions   Complete by: As directed    No lifting > 10 lbs   May shower / Bathe   Complete by: As directed    48 hours after surgery   May walk up steps   Complete by: As directed     No dressing needed   Complete by: As directed    Other Restrictions   Complete by: As directed    No bending/twisting at waist      Allergies as of 04/09/2023   No Known Allergies      Medication List     TAKE these medications    acetaminophen 500 MG tablet Commonly known as: TYLENOL Take 1,000 mg by mouth every 6 (six) hours as needed for moderate pain (pain score 4-6).   ibuprofen 200 MG tablet Commonly known as: ADVIL Take 400 mg by mouth every 6 (six) hours as needed for moderate pain (pain score 4-6).   Oxycodone HCl 10 MG Tabs Take 1 tablet (10 mg total) by mouth every 4 (four) hours as needed for up to 7 days for moderate pain (pain score 4-6), severe pain (pain score 7-10) or breakthrough pain (If can take orals).               Discharge Care Instructions  (From admission, onward)           Start     Ordered   04/09/23 0000  No dressing needed        04/09/23 1255             Signed: Jackelyn Hoehn 04/09/2023, 12:55 PM

## 2023-05-04 NOTE — Therapy (Unsigned)
OUTPATIENT OCCUPATIONAL THERAPY NEURO EVALUATION  Patient Name: Todd Mcintyre MRN: [id number] DOB:1995-10-13, 27 y.o., male Today's Date: 05/05/2023  PCP: none REFERRING PROVIDER: Dr. Conchita Paris  END OF SESSION:  OT End of Session - 05/05/23 0901     Visit Number 1    Number of Visits 18    Date for OT Re-Evaluation 07/28/23    Authorization Type [employer] MCD, no auth required    Authorization Time Period 12 weeks    OT Start Time 0855    OT Stop Time 0930    OT Time Calculation (min) 35 min    Activity Tolerance Patient tolerated treatment well    Behavior During Therapy [facility] for tasks assessed/performed             Past Medical History:  Diagnosis Date   Concussion without loss of consciousness 02/06/2013   Neuromuscular disorder (HCC) 03/09/2023   Hx -facial numbness and left arm numbness   Syrinx of spinal cord (HCC) 03/09/2023   Past Surgical History:  Procedure Laterality Date   ADENOIDECTOMY     LAPAROSCOPIC APPENDECTOMY N/A 06/11/2014   Procedure: APPENDECTOMY LAPAROSCOPIC;  Surgeon: Atilano Ina, MD;  Location: [facility] OR;  Service: General;  Laterality: N/A;   SUBOCCIPITAL CRANIECTOMY CERVICAL LAMINECTOMY N/A 04/06/2023   Procedure: CHIARI DECOMPRESSION;  Surgeon: Lisbeth Renshaw, MD;  Location: [facility] OR;  Service: Neurosurgery;  Laterality: N/A;   TONSILLECTOMY     Patient Active Problem List   Diagnosis Date Noted   Chiari malformation type I (HCC) 04/06/2023   Chiari I malformation (HCC) 04/06/2023   Visit for well man health check 10/22/2016   Screening examination for STD (sexually transmitted disease) 10/22/2016    ONSET DATE: 04/06/23  REFERRING DIAG: G93.5 Chiari malfrormation type I  THERAPY DIAG:  Visuospatial deficit  Other lack of coordination  Muscle weakness (generalized)  Unsteadiness on feet  Rationale for Evaluation and Treatment: Rehabilitation  SUBJECTIVE:   SUBJECTIVE STATEMENT: 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 PMH: appendectomy, tonsillectomy, several ED visits for facial numbness and L side numbness PTA pt lives independently with his girlfriend and 2 small children. 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 Yes: NPRS scale: 4/10 Pain location: LUE Pain description: numbness Aggravating factors: sleeping on L side Relieving factors: positioning  FALLS: Has patient fallen in last 6 months? No  LIVING ENVIRONMENT: Lives with: lives with an adult companion Lives in: House/apartment Stairs: Yes: Internal:  Has following equipment at home: None  PLOF: Independent  PATIENT GOALS: improve independence, improve vision and strength  OBJECTIVE:  Note: Objective measures were completed at Evaluation unless otherwise noted.  HAND DOMINANCE: Right  ADLs: Overall ADLs: mod I with all basic ADLs Transfers/ambulation related to ADLs:  UB Dressing: mod I LB Dressing: mod I Toileting: mod I Bathing: mod I Tub Shower transfers: mod I walkin shower  IADLs:  Light housekeeping: has done light tasks but not back to prior level Meal Prep: has cooked but  pt reports  left hand was on hot pan, cautioned against cooking due to risk for injury Community mobility: mod I Medication management: Pt handles    MOBILITY STATUS: Independent    UPPER EXTREMITY ROM:  RUE WFLS  Active ROM Right eval Left eval  Shoulder flexion  120  Shoulder abduction    Shoulder adduction    Shoulder extension    Shoulder internal rotation    Shoulder external rotation    Elbow flexion  WFLs  Elbow extension  -10  Wrist flexion    Wrist extension    Wrist ulnar deviation    Wrist radial  deviation    Wrist pronation    Wrist supination    (Blank rows = not tested)  UPPER EXTREMITY MMT:     MMT Right eval Left eval  Shoulder flexion 5/5 3+/5  Shoulder abduction    Shoulder adduction    Shoulder extension    Shoulder internal rotation    Shoulder external rotation    Middle trapezius    Lower trapezius    Elbow flexion 5/5 4-/5  Elbow extension 5/5 4-/5  Wrist flexion    Wrist extension    Wrist ulnar deviation    Wrist radial deviation    Wrist pronation    Wrist supination    (Blank rows = not tested)  HAND FUNCTION: Grip strength: Right: 107 lbs; Left: 53 lbs  COORDINATION: 9 Hole Peg test: Right: 33.30 sec; Left: 49.02 sec   SENSATION: Light touch: Impaired  for LUE, pt also reports impaired hot/ cold sensation   COGNITION: Overall cognitive status: Within functional limits for tasks assessed  VISION: Subjective report: Pt reports double vision Baseline vision: No visual deficits   VISION ASSESSMENT: Eye alignment: Impaired:  Gaze preference/alignment: WDL Tracking/Visual pursuits: Right eye does not track laterally Saccades: additional eye shifts occurred during testing Convergence: Impaired:  Diplopia assessment: objects splits side to side and present in primary gaze  Patient has difficulty with following activities due to following visual impairments: reading, pt arrived wearing eye patch on R eye. He reports that he has claer glasses with tape to help with diplopia at home.  PERCEPTION: Impaired: depth perception    OBSERVATIONS: Pleasant 27 y.o who was cooperative throughout eval accompanied by his GF   TODAY'S TREATMENT:                                                                                                                              DATE: 05/05/23- eval   PATIENT EDUCATION: Education details: role of OT,  potential goals, recommendation that pt does not drive due to visual deficits, recommendation that pt does  not cook yet due to sensory deficits. Person educated: Patient and significant other Todd Mcintyre Education method: Explanation Education comprehension: verbalized understanding  HOME EXERCISE PROGRAM: n/a   GOALS: Goals reviewed with patient? Yes  SHORT TERM GOALS: Target date: 110/24  I with inital HEP. Baseline:dependent Goal status: INITIAL  2.   Pt will verbalize understanding of compensatory strategies for vision Baseline: dependent Goal status: INITIAL  3.  Pt will demonstrate improved LUE fine motor coordiantion as evidenced by decreasing 9 hole peg test score to  45 secs or less Baseline: see above Goal status: INITIAL  4.  Pt will increase LUE grip strength to 60 lbs or better for improved LUE functional use. Baseline:see above  Goal status: INITIAL  5.  Pt will verbalize understanding of sensory precautions for LUE Baseline: dependent Goal status: INITIAL    6 Pt will demonstrate ability to read a short paragraph without complaints of diplopia. Baseline: unable  Goal status: INITIAL  LONG TERM GOALS: Target date: 07/28/22  I with updated HEP Baseline: dependent Goal status: INITIAL  2.  Pt will demonstrate ability to read a  a page of newsprint without complaints of diplopia. Baseline: unable due to diplopia Goal status: INITIAL  3.  Pt will demonstrate improved LUE fine motor coordiantion as evidenced by decreasing 9 hole peg test score to 40 secs or less Baseline:see above  Goal status: INITIAL  4.  Pt will increase LUE grip strength to 68 lbs or greater for increased functional use. Baseline: see above Goal status: INITIAL  5.  Pt will resume performance of cooking activities mod I with good safety awareness Baseline: currently pt has touched hot object with LUE while cooking due to decreased sensation Goal status: INITIAL  6.  Pt will resume performance of mod complex home management mod I Baseline: has not resumed prior level due to deficits. Goal  status: INITIAL 7 Pt will perfrom simulated work activities mod I  Baseline:dependent  Goal status:inital  ASSESSMENT:  CLINICAL IMPRESSION: Pt is 27 yo male who hospitalized on 04/06/23 for Arnold-Chiari malformation repair (suboccipital craniectomy with expansile duraplasty with C1 laminectomy). PMH: appendectomy, tonsillectomy, several ED visits for facial numbness and L side numbness PTA pt lives independently with his girlfriend and 2 small children. 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 Pt presents with the following deficits: visual deficits(diplopia), decreased coordiantion, decreased balance, decreased functional mobility, sensory deficits, decreased strength which impedes perfromance of ADLs/IADLs. Pt can benefit from skilled occupational therapy to address these deficits in order to maximize pt's safeyt and I with daily activities.   PERFORMANCE DEFICITS: in functional skills including ADLs, IADLs, coordination, dexterity, sensation, ROM, strength, Fine motor control, Gross motor control, mobility, balance, endurance, decreased knowledge of precautions, vision, and UE functional use,  and psychosocial skills including coping strategies, environmental adaptation, habits, interpersonal interactions, and routines and behaviors.   IMPAIRMENTS: are limiting patient from ADLs, IADLs, rest and sleep, work, play, leisure, and social participation.   CO-MORBIDITIES: may have co-morbidities  that affects occupational performance. Patient will benefit from skilled OT to address above impairments and improve overall function.  MODIFICATION OR ASSISTANCE TO COMPLETE EVALUATION: No modification of tasks or assist necessary to complete an evaluation.  OT OCCUPATIONAL PROFILE AND HISTORY: Detailed assessment: Review of records and additional review of physical, cognitive, psychosocial history related to current functional performance.  CLINICAL DECISION MAKING: LOW - limited  treatment options, no task modification necessary  REHAB POTENTIAL: Good  EVALUATION COMPLEXITY: Low    PLAN:  OT FREQUENCY:  17 visits plus eval  OT DURATION: 12 weeks  PLANNED INTERVENTIONS: 97168 OT Re-evaluation, 97535 self care/ADL training, 40981 therapeutic exercise, 97530 therapeutic activity, 97112 neuromuscular re-education, 97140 manual therapy, 97113 aquatic therapy, 97035 ultrasound, 97018 paraffin, 19147 moist heat, 97010 cryotherapy, 97034 contrast bath, passive range of motion, balance training, functional mobility training, visual/perceptual remediation/compensation, energy conservation, coping strategies training, patient/family education, and DME and/or AE instructions  RECOMMENDED OTHER SERVICES: PT  CONSULTED AND AGREED WITH PLAN OF CARE:  Patient and family member/caregiver- GF, Todd Mcintyre  PLAN FOR NEXT SESSION: further assess vision and educate regarding vision compensations   Danine Hor, OT 05/05/2023, 9:55 AM

## 2023-05-05 ENCOUNTER — Ambulatory Visit: Payer: Medicaid Other | Attending: Neurosurgery | Admitting: Occupational Therapy

## 2023-05-05 ENCOUNTER — Other Ambulatory Visit: Payer: Self-pay

## 2023-05-05 ENCOUNTER — Ambulatory Visit: Payer: Medicaid Other

## 2023-05-05 DIAGNOSIS — R278 Other lack of coordination: Secondary | ICD-10-CM | POA: Diagnosis present

## 2023-05-05 DIAGNOSIS — R2681 Unsteadiness on feet: Secondary | ICD-10-CM | POA: Diagnosis present

## 2023-05-05 DIAGNOSIS — R41842 Visuospatial deficit: Secondary | ICD-10-CM | POA: Insufficient documentation

## 2023-05-05 DIAGNOSIS — M6281 Muscle weakness (generalized): Secondary | ICD-10-CM | POA: Diagnosis present

## 2023-05-05 DIAGNOSIS — R2689 Other abnormalities of gait and mobility: Secondary | ICD-10-CM | POA: Insufficient documentation

## 2023-05-05 DIAGNOSIS — R208 Other disturbances of skin sensation: Secondary | ICD-10-CM | POA: Diagnosis present

## 2023-05-05 DIAGNOSIS — R262 Difficulty in walking, not elsewhere classified: Secondary | ICD-10-CM | POA: Insufficient documentation

## 2023-05-05 NOTE — Therapy (Signed)
OUTPATIENT PHYSICAL THERAPY NEURO EVALUATION   Patient Name: Todd Mcintyre MRN: [id number] DOB:1995-07-04, 27 y.o., male Today's Date: 05/05/2023   PCP: no REFERRING PROVIDER: Lisbeth Renshaw, MD  END OF SESSION:  PT End of Session - 05/05/23 0940     Visit Number 1    Date for PT Re-Evaluation 07/28/23    Progress Note Due on Visit 10    PT Start Time 0938    PT Stop Time 1015    PT Time Calculation (min) 37 min    Activity Tolerance Patient tolerated treatment well    Behavior During Therapy [facility] for tasks assessed/performed             Past Medical History:  Diagnosis Date   Concussion without loss of consciousness 02/06/2013   Neuromuscular disorder (HCC) 03/09/2023   Hx -facial numbness and left arm numbness   Syrinx of spinal cord (HCC) 03/09/2023   Past Surgical History:  Procedure Laterality Date   ADENOIDECTOMY     LAPAROSCOPIC APPENDECTOMY N/A 06/11/2014   Procedure: APPENDECTOMY LAPAROSCOPIC;  Surgeon: Atilano Ina, MD;  Location: [facility] OR;  Service: General;  Laterality: N/A;   SUBOCCIPITAL CRANIECTOMY CERVICAL LAMINECTOMY N/A 04/06/2023   Procedure: CHIARI DECOMPRESSION;  Surgeon: Lisbeth Renshaw, MD;  Location: [facility] OR;  Service: Neurosurgery;  Laterality: N/A;   TONSILLECTOMY     Patient Active Problem List   Diagnosis Date Noted   Chiari malformation type I (HCC) 04/06/2023   Chiari I malformation (HCC) 04/06/2023   Visit for well man health check 10/22/2016   Screening examination for STD (sexually transmitted disease) 10/22/2016    ONSET DATE: 04/06/23  REFERRING DIAG: Chiari decompression  THERAPY DIAG:  Other lack of coordination  Unsteadiness on feet  Difficulty in walking, not elsewhere classified  Imbalance  Rationale for Evaluation and Treatment: Rehabilitation  SUBJECTIVE:                                                                                                                                                                                              SUBJECTIVE STATEMENT: Underwent emergency surgery to decompress Chiari malformation , referred to PT and OT for rehab Pt accompanied by: significant other  PERTINENT HISTORY: progression of double vision, coordination deficits, underwent emergent surgery  PAIN:  Are you having pain? No  PRECAUTIONS: None  RED FLAGS: None   WEIGHT BEARING RESTRICTIONS: No  FALLS: Has patient fallen in last 6 months? No  LIVING ENVIRONMENT: Lives with: lives with their partner, lives with their son, and lives with their daughter, 71 and 3 y.o children Lives in: House/apartment Stairs: Yes: Internal:  11 steps; on right going up Has following equipment at home: None  PLOF: Independent, worked as Paediatric nurse  PATIENT GOALS: return to I life  OBJECTIVE:  Note: Objective measures were completed at Evaluation unless otherwise noted.  DIAGNOSTIC FINDINGS: na  COGNITION: Overall cognitive status: Within functional limits for tasks assessed   SENSATION: WFL  COORDINATION: Delayed movements, decreased coordination L LE  EDEMA:  none   POSTURE: some weight bearing B forefeet  LOWER EXTREMITY ROM:  wfl B LE's    LOWER EXTREMITY MMT:  MMT LE's wfl       GAIT: Gait pattern: decreased heel strike L, shortened stride length B, some circumduction L LE  FUNCTIONAL TESTS:  Berg Balance Scale: 46/56    TODAY'S TREATMENT:                                                                                                                              DATE: 05/05/23:   Initiated neuromuscular re education activities, including: Parallel bars for rocker board side to side and for/back Parallel bars heel /toe rocks on [employer] tandem standing lead foot on airex , with horizontal head turns Alt toe taps to 4" step, tried to [employer] of 60 BPM   PATIENT EDUCATION: Education details: POC, goals Person educated: Patient Education method:  Programmer, multimedia, Demonstration, Tactile cues, and Verbal cues Education comprehension: verbalized understanding, returned demonstration, and verbal cues required  HOME EXERCISE PROGRAM: Advised to try the standing with lead foot on step with head turns, for home  GOALS: Goals reviewed with patient? Yes  SHORT TERM GOALS: Target date: 2 weeks 05/19/23  Able to follow home program I Baseline: Goal status: INITIAL   LONG TERM GOALS: Target date: 07/28/23 12 weeks   Berg increase from 44/56 to 50/56 Baseline:  Goal status: INITIAL  2.  Able to unilaterally stand on each leg 30 sec which is normal value for balance Baseline:  Goal status: INITIAL  3.  Able to walk up/ down curbs, stairs, uneven terrain I and without  loss of balance Baseline: needs one hand support for these activities Goal status: INITIAL  ASSESSMENT:  CLINICAL IMPRESSION: Patient is a 28 y.o. male who was evaluated today by physical therapy due to recent decline in function with chiari malformation and decompression surgery 4 weeks ago. He was working full  time as a Paediatric nurse, has 2 young children, was completely I prior to this incident. Presents with coordination and balance deficits with L LE, also double vision influencing his balance. He should benefit from skilled PT to address his deficits and assist him with improving his function.  OBJECTIVE IMPAIRMENTS: Abnormal gait, decreased balance, decreased coordination, and difficulty walking.   ACTIVITY LIMITATIONS: carrying, lifting, and standing  PARTICIPATION LIMITATIONS: meal prep, cleaning, laundry, community activity, occupation, and yard work  PERSONAL FACTORS: Education, Past/current experiences, and Transportation are also affecting patient's functional outcome.   REHAB POTENTIAL: Good  CLINICAL DECISION MAKING:  Evolving/moderate complexity  EVALUATION COMPLEXITY: Moderate  PLAN:  PT FREQUENCY: 2x/week  PT DURATION: 12 weeks  PLANNED  INTERVENTIONS: 97110-Therapeutic exercises, 97530- Therapeutic activity, [id number]- Neuromuscular re-education, 97535- Self Care, and 29562- Manual therapy  PLAN FOR NEXT SESSION: progress with standing balance, coordination activities    Tanmay Halteman L Emmarose Klinke, PT, DPT, OCS 05/05/2023, 1:02 PM

## 2023-05-06 ENCOUNTER — Ambulatory Visit: Payer: Medicaid Other | Admitting: Occupational Therapy

## 2023-05-06 ENCOUNTER — Encounter: Payer: Self-pay | Admitting: Occupational Therapy

## 2023-05-06 DIAGNOSIS — R41842 Visuospatial deficit: Secondary | ICD-10-CM

## 2023-05-06 DIAGNOSIS — R208 Other disturbances of skin sensation: Secondary | ICD-10-CM

## 2023-05-06 DIAGNOSIS — R278 Other lack of coordination: Secondary | ICD-10-CM

## 2023-05-06 DIAGNOSIS — M6281 Muscle weakness (generalized): Secondary | ICD-10-CM

## 2023-05-06 DIAGNOSIS — R2689 Other abnormalities of gait and mobility: Secondary | ICD-10-CM

## 2023-05-06 NOTE — Therapy (Signed)
OUTPATIENT OCCUPATIONAL THERAPY NEURO [facility]  Patient Name: Todd Mcintyre MRN: [id number] DOB:Feb 20, 1996, 27 y.o., male Today's Date: 05/06/2023  PCP: none REFERRING PROVIDER: Dr. Conchita Paris  END OF SESSION:    Past Medical History:  Diagnosis Date   Concussion without loss of consciousness 02/06/2013   Neuromuscular disorder (HCC) 03/09/2023   Hx -facial numbness and left arm numbness   Syrinx of spinal cord (HCC) 03/09/2023   Past Surgical History:  Procedure Laterality Date   ADENOIDECTOMY     LAPAROSCOPIC APPENDECTOMY N/A 06/11/2014   Procedure: APPENDECTOMY LAPAROSCOPIC;  Surgeon: Atilano Ina, MD;  Location: [facility] OR;  Service: General;  Laterality: N/A;   SUBOCCIPITAL CRANIECTOMY CERVICAL LAMINECTOMY N/A 04/06/2023   Procedure: CHIARI DECOMPRESSION;  Surgeon: Lisbeth Renshaw, MD;  Location: [facility] OR;  Service: Neurosurgery;  Laterality: N/A;   TONSILLECTOMY     Patient Active Problem List   Diagnosis Date Noted   Chiari malformation type I (HCC) 04/06/2023   Chiari I malformation (HCC) 04/06/2023   Visit for well man health check 10/22/2016   Screening examination for STD (sexually transmitted disease) 10/22/2016    ONSET DATE: 04/06/23  REFERRING DIAG: G93.5 Chiari malfrormation type I  THERAPY DIAG:  No diagnosis found.  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 PMH: appendectomy, tonsillectomy, several ED visits for facial numbness and L side numbness PTA pt lives independently with his girlfriend and 2 small children. 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 Yes: NPRS scale: 4/10 Pain location: LUE Pain description: numbness Aggravating factors: sleeping on L side Relieving factors: positioning  FALLS: Has patient fallen in last 6 months? No  LIVING ENVIRONMENT: Lives with: lives with an adult companion Lives in: House/apartment Stairs: Yes: Internal:  Has following equipment at home: None  PLOF: Independent  PATIENT GOALS: improve independence, improve vision and strength  OBJECTIVE:  Note: Objective measures were completed at Evaluation unless otherwise noted.  HAND DOMINANCE: Right  ADLs: Overall ADLs: mod I with all basic ADLs Transfers/ambulation related to ADLs:  UB Dressing: mod I LB Dressing: mod I Toileting: mod I Bathing: mod I Tub Shower transfers: mod I walkin shower  IADLs:  Light housekeeping: has done light tasks but not back to prior level Meal Prep: has cooked but  pt reports  left hand was on hot pan, cautioned against cooking due to risk for injury Community mobility: mod I Medication management: Pt handles    MOBILITY STATUS: Independent    UPPER EXTREMITY ROM:  RUE WFLS  Active ROM Right eval Left eval  Shoulder flexion  120  Shoulder abduction    Shoulder adduction    Shoulder extension    Shoulder internal rotation    Shoulder external rotation    Elbow flexion  WFLs  Elbow extension  -10  Wrist flexion    Wrist extension    Wrist ulnar deviation    Wrist radial deviation    Wrist pronation    Wrist supination    (Blank rows = not tested)  UPPER EXTREMITY MMT:     MMT Right eval Left eval  Shoulder flexion 5/5 3+/5  Shoulder abduction  Shoulder adduction    Shoulder extension    Shoulder internal rotation    Shoulder external rotation    Middle trapezius    Lower trapezius    Elbow flexion 5/5 4-/5  Elbow extension 5/5 4-/5  Wrist flexion    Wrist extension    Wrist ulnar deviation    Wrist radial deviation    Wrist pronation    Wrist  supination    (Blank rows = not tested)  HAND FUNCTION: Grip strength: Right: 107 lbs; Left: 53 lbs  COORDINATION: 9 Hole Peg test: Right: 33.30 sec; Left: 49.02 sec   SENSATION: Light touch: Impaired  for LUE, pt also reports impaired hot/ cold sensation   COGNITION: Overall cognitive status: Within functional limits for tasks assessed  VISION: Subjective report: Pt reports double vision Baseline vision: No visual deficits   VISION ASSESSMENT: Eye alignment: Impaired:  Gaze preference/alignment: WDL Tracking/Visual pursuits: Right eye does not track laterally Saccades: additional eye shifts occurred during testing Convergence: Impaired:  Diplopia assessment: objects splits side to side and present in primary gaze  Patient has difficulty with following activities due to following visual impairments: reading, pt arrived wearing eye patch on R eye. He reports that he has claer glasses with tape to help with diplopia at home.  PERCEPTION: Impaired: depth perception    OBSERVATIONS: Pleasant 27 y.o who was cooperative throughout eval accompanied by his GF   TODAY'S TREATMENT:                                                                                                                              DATE: 05/06/23- see pt instructions/ education  05/05/23- eval   PATIENT EDUCATION: Education details:HEP for diplopia, 5 reps each exercise, coordiantion HEP- see patient instructions, use of tape on clear glasses to  minimize diplopia Person educated: Patient  Education method: Explanation, demonstration, v.c Education comprehension: verbalized understanding, returned demonstration, v.c  HOME EXERCISE PROGRAM: diplopia HEP,coordiantion HEP   GOALS: Goals reviewed with patient? Yes  SHORT TERM GOALS: Target date: 110/24  I with inital HEP. Baseline:dependent Goal status: INITIAL  2.   Pt will verbalize understanding of compensatory strategies for  vision Baseline: dependent Goal status: INITIAL  3.  Pt will demonstrate improved LUE fine motor coordiantion as evidenced by decreasing 9 hole peg test score to 45 secs or less Baseline: see above Goal status: INITIAL  4.  Pt will increase LUE grip strength to 60 lbs or better for improved LUE functional use. Baseline:see above  Goal status: INITIAL  5.  Pt will verbalize understanding of sensory precautions for LUE Baseline: dependent Goal status: INITIAL    6 Pt will demonstrate ability to read a short paragraph without complaints of diplopia. Baseline: unable  Goal status: INITIAL  LONG TERM GOALS: Target date: 07/28/22  I with updated HEP Baseline: dependent Goal status: INITIAL  2.  Pt will demonstrate ability to read a  a page of  [employer] without complaints of diplopia. Baseline: unable due to diplopia Goal status: INITIAL  3.  Pt will demonstrate improved LUE fine motor coordiantion as evidenced by decreasing 9 hole peg test score to 40 secs or less Baseline:see above  Goal status: INITIAL  4.  Pt will increase LUE grip strength to 68 lbs or greater for increased functional use. Baseline: see above Goal status: INITIAL  5.  Pt will resume performance of cooking activities mod I with good safety awareness Baseline: currently pt has touched hot object with LUE while cooking due to decreased sensation Goal status: INITIAL  6.  Pt will resume performance of mod complex home management mod I Baseline: has not resumed prior level due to deficits. Goal status: INITIAL 7 Pt will perfrom simulated work activities mod I  Baseline:dependent  Goal status:inital  ASSESSMENT:  CLINICAL IMPRESSION: Pt is 27 yo male who hospitalized on 04/06/23 for Arnold-Chiari malformation repair (suboccipital craniectomy with expansile duraplasty with C1 laminectomy). PMH: appendectomy, tonsillectomy, several ED visits for facial numbness and L side numbness PTA pt lives independently  with his girlfriend and 2 small children. 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 Pt presents with the following deficits: visual deficits(diplopia), decreased coordiantion, decreased balance, decreased functional mobility, sensory deficits, decreased strength which impedes perfromance of ADLs/IADLs. Pt can benefit from skilled occupational therapy to address these deficits in order to maximize pt's safeyt and I with daily activities.   PERFORMANCE DEFICITS: in functional skills including ADLs, IADLs, coordination, dexterity, sensation, ROM, strength, Fine motor control, Gross motor control, mobility, balance, endurance, decreased knowledge of precautions, vision, and UE functional use,  and psychosocial skills including coping strategies, environmental adaptation, habits, interpersonal interactions, and routines and behaviors.   IMPAIRMENTS: are limiting patient from ADLs, IADLs, rest and sleep, work, play, leisure, and social participation.   CO-MORBIDITIES: may have co-morbidities  that affects occupational performance. Patient will benefit from skilled OT to address above impairments and improve overall function.  MODIFICATION OR ASSISTANCE TO COMPLETE EVALUATION: No modification of tasks or assist necessary to complete an evaluation.  OT OCCUPATIONAL PROFILE AND HISTORY: Detailed assessment: Review of records and additional review of physical, cognitive, psychosocial history related to current functional performance.  CLINICAL DECISION MAKING: LOW - limited treatment options, no task modification necessary  REHAB POTENTIAL: Good  EVALUATION COMPLEXITY: Low    PLAN:  OT FREQUENCY:  17 visits plus eval  OT DURATION: 12 weeks  PLANNED INTERVENTIONS: 97168 OT Re-evaluation, 97535 self care/ADL training, 44034 therapeutic exercise, 97530 therapeutic activity, 97112 neuromuscular re-education, 97140 manual therapy, 97113 aquatic therapy, 97035 ultrasound, 97018 paraffin,  74259 moist heat, 97010 cryotherapy, 97034 contrast bath, passive range of motion, balance training, functional mobility training, visual/perceptual remediation/compensation, energy conservation, coping strategies training, patient/family education, and DME and/or AE instructions  RECOMMENDED OTHER SERVICES: PT  CONSULTED AND AGREED WITH PLAN OF CARE: Patient and family member/caregiver- Coral Spikes  PLAN FOR NEXT SESSION: further assess vision and educate regarding vision compensations   Leylany Nored, OT 05/06/2023, 2:04 PM

## 2023-05-06 NOTE — Patient Instructions (Addendum)
  Coordination Activities  Perform the following activities for 20 minutes 1 times per day with left hand(s).   Toss ball between hands. Flip cards 1 at a time as fast as you can. Deal cards with your thumb (Hold deck in hand and push card off top with thumb). Pick up coins and stack. Pick up coins one at a time until you get 5-10 in your hand, then move coins from palm to fingertips to place in container 1  at a time                Double vision HEP:  Perform at least 1-2 times per day. Stop if your eye becomes fatigued or hurts and try again later.  1. Hold a small object/card in front of you.  Hold it in the middle at arm's length away.    2. Cover your LEFT eye and look at the object with your RIGHT eye.  3. Slowly move the object side to side in front of you while continuing to watch it with your RIGHT eye.  4.  Remember to keep your head still and only move your eye.  5.  Repeat 5-10 times.  6.  Then, move object up and down while watching it 5-10 times.  7. Cover your RIGHT eye and look at the object with your LEFT eye while you repeat #1-6 above.

## 2023-05-06 NOTE — Therapy (Signed)
OUTPATIENT PHYSICAL THERAPY NEURO TREATMENT   Patient Name: Todd Mcintyre MRN: [id number] DOB:06-Feb-1996, 27 y.o., male Today's Date: 05/07/2023   PCP: no REFERRING PROVIDER: Lisbeth Renshaw, MD  END OF SESSION:  PT End of Session - 05/07/23 1056     Visit Number 2    Date for PT Re-Evaluation 07/28/23    Progress Note Due on Visit 10    PT Start Time 1100    PT Stop Time 1145    PT Time Calculation (min) 45 min    Activity Tolerance Patient tolerated treatment well    Behavior During Therapy [facility] for tasks assessed/performed              Past Medical History:  Diagnosis Date   Concussion without loss of consciousness 02/06/2013   Neuromuscular disorder (HCC) 03/09/2023   Hx -facial numbness and left arm numbness   Syrinx of spinal cord (HCC) 03/09/2023   Past Surgical History:  Procedure Laterality Date   ADENOIDECTOMY     LAPAROSCOPIC APPENDECTOMY N/A 06/11/2014   Procedure: APPENDECTOMY LAPAROSCOPIC;  Surgeon: Atilano Ina, MD;  Location: [facility] OR;  Service: General;  Laterality: N/A;   SUBOCCIPITAL CRANIECTOMY CERVICAL LAMINECTOMY N/A 04/06/2023   Procedure: CHIARI DECOMPRESSION;  Surgeon: Lisbeth Renshaw, MD;  Location: [facility] OR;  Service: Neurosurgery;  Laterality: N/A;   TONSILLECTOMY     Patient Active Problem List   Diagnosis Date Noted   Chiari malformation type I (HCC) 04/06/2023   Chiari I malformation (HCC) 04/06/2023   Visit for well man health check 10/22/2016   Screening examination for STD (sexually transmitted disease) 10/22/2016    ONSET DATE: 04/06/23  REFERRING DIAG: Chiari decompression  THERAPY DIAG:  Other lack of coordination  Visuospatial deficit  Muscle weakness (generalized)  Other abnormalities of gait and mobility  Other disturbances of skin sensation  Unsteadiness on feet  Difficulty in walking, not elsewhere classified  Rationale for Evaluation and Treatment: Rehabilitation  SUBJECTIVE:                                                                                                                                                                                              SUBJECTIVE STATEMENT: Nothing new. Still seeing double, the patch is helping some. No falls but I do stumble a lot.   Pt accompanied by: significant other  PERTINENT HISTORY: progression of double vision, coordination deficits, underwent emergent surgery  PAIN:  Are you having pain? No  PRECAUTIONS: None  RED FLAGS: None   WEIGHT BEARING RESTRICTIONS: No  FALLS: Has patient fallen in last 6 months? No  LIVING ENVIRONMENT: Lives with:  lives with their partner, lives with their son, and lives with their daughter, 30 and 39 y.o children Lives in: House/apartment Stairs: Yes: Internal: 11 steps; on right going up Has following equipment at home: None  PLOF: Independent, worked as Paediatric nurse  PATIENT GOALS: return to I life  OBJECTIVE:  Note: Objective measures were completed at Evaluation unless otherwise noted.  DIAGNOSTIC FINDINGS: na  COGNITION: Overall cognitive status: Within functional limits for tasks assessed   SENSATION: WFL  COORDINATION: Delayed movements, decreased coordination L LE  EDEMA:  none   POSTURE: some weight bearing B forefeet  LOWER EXTREMITY ROM:  wfl B LE's    LOWER EXTREMITY MMT:  MMT LE's wfl    GAIT: Gait pattern: decreased heel strike L, shortened stride length B, some circumduction L LE  FUNCTIONAL TESTS:  Berg Balance Scale: 46/56    TODAY'S TREATMENT:                                                                                                                              DATE:  05/07/23 NuStep L5x52mins  [employer] taps minA  Side steps on airex minA  Tandem standing in bars 30s SLS in bars 10s Standing on airex head turns  Standing on airex reaching for numbers on wall  Balance on BOSU  STS on airex 2x10 Shoulder ext 5# x10, 10# x10   05/05/23:    Initiated neuromuscular re education activities, including: Parallel bars for rocker board side to side and for/back Parallel bars heel /toe rocks on [employer] tandem standing lead foot on airex , with horizontal head turns Alt toe taps to 4" step, tried to [employer] of 60 BPM   PATIENT EDUCATION: Education details: POC, goals Person educated: Patient Education method: Programmer, multimedia, Demonstration, Tactile cues, and Verbal cues Education comprehension: verbalized understanding, returned demonstration, and verbal cues required  HOME EXERCISE PROGRAM: Advised to try the standing with lead foot on step with head turns, for home  GOALS: Goals reviewed with patient? Yes  SHORT TERM GOALS: Target date: 2 weeks 05/19/23  Able to follow home program I Baseline: Goal status: INITIAL   LONG TERM GOALS: Target date: 07/28/23 12 weeks   Berg increase from 44/56 to 50/56 Baseline:  Goal status: INITIAL  2.  Able to unilaterally stand on each leg 30 sec which is normal value for balance Baseline:  Goal status: INITIAL  3.  Able to walk up/ down curbs, stairs, uneven terrain I and without  loss of balance Baseline: needs one hand support for these activities Goal status: INITIAL  ASSESSMENT:  CLINICAL IMPRESSION: Patient is a 27 y.o. male who was evaluated today by physical therapy due to recent decline in function with chiari malformation and decompression surgery 4 weeks ago. Presents with coordination and balance deficits, also double vision influencing his balance. Increased difficulty with single leg activities on the LLE. He stumbles a bit with repetitive movements like cone taps and  step ups on airex. Patient has c/o L UE weakness and numbness that he reports has not improved in over a month now. Some fatigue throughout session. He should benefit from skilled PT to address his deficits and assist him with improving his function.   OBJECTIVE IMPAIRMENTS: Abnormal gait,  decreased balance, decreased coordination, and difficulty walking.   ACTIVITY LIMITATIONS: carrying, lifting, and standing  PARTICIPATION LIMITATIONS: meal prep, cleaning, laundry, community activity, occupation, and yard work  PERSONAL FACTORS: Education, Past/current experiences, and Transportation are also affecting patient's functional outcome.   REHAB POTENTIAL: Good  CLINICAL DECISION MAKING: Evolving/moderate complexity  EVALUATION COMPLEXITY: Moderate  PLAN:  PT FREQUENCY: 2x/week  PT DURATION: 12 weeks  PLANNED INTERVENTIONS: 97110-Therapeutic exercises, 97530- Therapeutic activity, 97112- Neuromuscular re-education, 97535- Self Care, and 60454- Manual therapy  PLAN FOR NEXT SESSION: progress with standing balance, coordination activities and multitasking skills    Cassie Freer, PT, DPT 05/07/2023, 11:45 AM

## 2023-05-07 ENCOUNTER — Ambulatory Visit: Payer: Medicaid Other

## 2023-05-07 DIAGNOSIS — R41842 Visuospatial deficit: Secondary | ICD-10-CM | POA: Diagnosis not present

## 2023-05-07 DIAGNOSIS — R208 Other disturbances of skin sensation: Secondary | ICD-10-CM

## 2023-05-07 DIAGNOSIS — R2681 Unsteadiness on feet: Secondary | ICD-10-CM

## 2023-05-07 DIAGNOSIS — R278 Other lack of coordination: Secondary | ICD-10-CM

## 2023-05-07 DIAGNOSIS — R2689 Other abnormalities of gait and mobility: Secondary | ICD-10-CM

## 2023-05-07 DIAGNOSIS — M6281 Muscle weakness (generalized): Secondary | ICD-10-CM

## 2023-05-07 DIAGNOSIS — R262 Difficulty in walking, not elsewhere classified: Secondary | ICD-10-CM

## 2023-05-11 ENCOUNTER — Encounter: Payer: Medicaid Other | Admitting: Occupational Therapy

## 2023-05-13 ENCOUNTER — Ambulatory Visit: Payer: Medicaid Other | Admitting: Physical Therapy

## 2023-05-13 ENCOUNTER — Ambulatory Visit: Payer: Medicaid Other | Admitting: Occupational Therapy

## 2023-05-14 ENCOUNTER — Encounter (HOSPITAL_COMMUNITY): Payer: Self-pay | Admitting: *Deleted

## 2023-05-14 ENCOUNTER — Emergency Department (HOSPITAL_COMMUNITY)
Admission: EM | Admit: 2023-05-14 | Discharge: 2023-05-15 | Disposition: A | Discharge status: Home / Self Care | Payer: Medicaid Other | Attending: Emergency Medicine | Admitting: Emergency Medicine

## 2023-05-14 ENCOUNTER — Other Ambulatory Visit: Payer: Self-pay

## 2023-05-14 DIAGNOSIS — Z1152 Encounter for screening for COVID-19: Secondary | ICD-10-CM | POA: Diagnosis not present

## 2023-05-14 DIAGNOSIS — R519 Headache, unspecified: Secondary | ICD-10-CM | POA: Diagnosis present

## 2023-05-14 NOTE — ED Triage Notes (Signed)
The pt has had a headache for 2-3 days  he had a curiare surgery November 12 th

## 2023-05-15 ENCOUNTER — Emergency Department (HOSPITAL_COMMUNITY): Payer: Medicaid Other

## 2023-05-15 LAB — RESP PANEL BY RT-PCR (RSV, FLU A&B, COVID)  RVPGX2
Influenza A by PCR: NEGATIVE
Influenza B by PCR: NEGATIVE
Resp Syncytial Virus by PCR: NEGATIVE
SARS Coronavirus 2 by RT PCR: NEGATIVE

## 2023-05-15 LAB — COMPREHENSIVE METABOLIC PANEL
ALT: 157 U/L — ABNORMAL HIGH (ref 0–44)
AST: 68 U/L — ABNORMAL HIGH (ref 15–41)
Albumin: 4 g/dL (ref 3.5–5.0)
Alkaline Phosphatase: 62 U/L (ref 38–126)
Anion gap: 11 (ref 5–15)
BUN: 13 mg/dL (ref 6–20)
CO2: 22 mmol/L (ref 22–32)
Calcium: 9.5 mg/dL (ref 8.9–10.3)
Chloride: 97 mmol/L — ABNORMAL LOW (ref 98–111)
Creatinine, Ser: 0.99 mg/dL (ref 0.61–1.24)
GFR, Estimated: >60 mL/min (ref >60)
Glucose, Bld: 113 mg/dL — ABNORMAL HIGH (ref 70–99)
Potassium: 4 mmol/L (ref 3.5–5.1)
Sodium: 130 mmol/L — ABNORMAL LOW (ref 135–145)
Total Bilirubin: 1 mg/dL (ref <1.2)
Total Protein: 7.4 g/dL (ref 6.5–8.1)

## 2023-05-15 LAB — CBC WITH DIFFERENTIAL/PLATELET
Abs Immature Granulocytes: 0.06 10*3/uL (ref 0.00–0.07)
Basophils Absolute: 0.1 10*3/uL (ref 0.0–0.1)
Basophils Relative: 0 %
Eosinophils Absolute: 0 10*3/uL (ref 0.0–0.5)
Eosinophils Relative: 0 %
HCT: 41.7 % (ref 39.0–52.0)
Hemoglobin: 13.3 g/dL (ref 13.0–17.0)
Immature Granulocytes: 0 %
Lymphocytes Relative: 10 %
Lymphs Abs: 1.6 10*3/uL (ref 0.7–4.0)
MCH: 27.6 pg (ref 26.0–34.0)
MCHC: 31.9 g/dL (ref 30.0–36.0)
MCV: 86.5 fL (ref 80.0–100.0)
Monocytes Absolute: 1.1 10*3/uL — ABNORMAL HIGH (ref 0.1–1.0)
Monocytes Relative: 7 %
Neutro Abs: 13.4 10*3/uL — ABNORMAL HIGH (ref 1.7–7.7)
Neutrophils Relative %: 83 %
Platelets: 243 10*3/uL (ref 150–400)
RBC: 4.82 MIL/uL (ref 4.22–5.81)
RDW: 12.4 % (ref 11.5–15.5)
WBC: 16.2 10*3/uL — ABNORMAL HIGH (ref 4.0–10.5)
nRBC: 0 % (ref 0.0–0.2)

## 2023-05-15 LAB — I-STAT CG4 LACTIC ACID, ED: Lactic Acid, Venous: 0.8 mmol/L (ref 0.5–1.9)

## 2023-05-15 MED ORDER — DIPHENHYDRAMINE HCL 50 MG/ML IJ SOLN
25.0000 mg | Freq: Once | INTRAMUSCULAR | Status: AC
  Administered 2023-05-15: 25 mg via INTRAVENOUS
  Filled 2023-05-15: qty 1

## 2023-05-15 MED ORDER — METOCLOPRAMIDE HCL 5 MG/ML IJ SOLN
10.0000 mg | INTRAMUSCULAR | Status: AC
  Administered 2023-05-15: 10 mg via INTRAVENOUS
  Filled 2023-05-15: qty 2

## 2023-05-15 MED ORDER — KETOROLAC TROMETHAMINE 30 MG/ML IJ SOLN
30.0000 mg | Freq: Once | INTRAMUSCULAR | Status: AC
  Administered 2023-05-15: 30 mg via INTRAVENOUS
  Filled 2023-05-15: qty 1

## 2023-05-15 MED ORDER — PROCHLORPERAZINE EDISYLATE 10 MG/2ML IJ SOLN
10.0000 mg | Freq: Once | INTRAMUSCULAR | Status: AC
  Administered 2023-05-15: 10 mg via INTRAVENOUS
  Filled 2023-05-15: qty 2

## 2023-05-15 MED ORDER — IOHEXOL 350 MG/ML SOLN
75.0000 mL | Freq: Once | INTRAVENOUS | Status: AC | PRN
  Administered 2023-05-15: 75 mL via INTRAVENOUS

## 2023-05-15 NOTE — ED Provider Notes (Signed)
[place] EMERGENCY DEPARTMENT AT [facility] Provider Note   CSN: [id number] Arrival date & time: 05/14/23  2313     History  Chief Complaint  Patient presents with   Headache    Todd Mcintyre is a 27 y.o. male.  The history is provided by the patient and medical records.  Headache  27 year old male with history of Chiari malformation status post repair 04/06/23 with Dr. Conchita Paris, presenting to the ED with headache.  Patient states headache began yesterday morning around 11 AM and has been progressively worsening.  Described as a throbbing type pain, initially was in the temples but now seems all across the forehead.  He did have some vomiting yesterday.  He denies any fever or chills.  He does have a little bit of light sensitivity.  He has had diplopia since prior to his surgical repair, this remains unchanged, specifically is not worsening.  Also reports he has had some "pressure" in the back of his head along his incision.  Girlfriend reports they have been monitoring this at home, this morning seem to have a small amount of drainage from the superior portion of the incision and that it seems a little more swollen today.  Home Medications Prior to Admission medications   Medication Sig Start Date End Date Taking? Authorizing Provider  acetaminophen (TYLENOL) 500 MG tablet Take 1,000 mg by mouth every 6 (six) hours as needed for moderate pain (pain score 4-6).    [provider]  ibuprofen (ADVIL) 200 MG tablet Take 400 mg by mouth every 6 (six) hours as needed for moderate pain (pain score 4-6).    [provider]      Allergies    Patient has no known allergies.    Review of Systems   Review of Systems  Neurological:  Positive for headaches.  All other systems reviewed and are negative.   Physical Exam Updated Vital Signs BP (!) 155/100 (BP Location: Right Arm)   Pulse (!) 118   Temp 99 F (37.2 C) (Oral)   Resp 19   Ht 6'  (1.829 m)   Wt 104.3 kg   SpO2 95%   BMI 31.19 kg/m   Physical Exam Vitals and nursing note reviewed.  Constitutional:      Appearance: He is well-developed.  HENT:     Head: Normocephalic and atraumatic.     Comments: Surgical incision posterior scalp, small scab along top, does seem to have a little puffiness along this, no active drainage/bleeding    Nose: Congestion present.  Eyes:     Conjunctiva/sclera: Conjunctivae normal.     Pupils: Pupils are equal, round, and reactive to light.  Neck:     Comments: Full ROM, no rigidity Cardiovascular:     Rate and Rhythm: Normal rate and regular rhythm.     Heart sounds: Normal heart sounds.  Pulmonary:     Effort: Pulmonary effort is normal.     Breath sounds: Normal breath sounds.  Abdominal:     General: Bowel sounds are normal.     Palpations: Abdomen is soft.  Musculoskeletal:        General: Normal range of motion.     Cervical back: Normal range of motion.  Skin:    General: Skin is warm and dry.  Neurological:     Mental Status: He is alert and oriented to person, place, and time.     Comments: AAOx3, answering questions and following commands appropriately; equal strength UE  and LE bilaterally; CN grossly intact; moves all extremities appropriately without ataxia; no focal neuro deficits or facial asymmetry appreciated Of note, headache worsened with postural changes     ED Results / Procedures / Treatments   Labs (all labs ordered are listed, but only abnormal results are displayed) Labs Reviewed  CBC WITH DIFFERENTIAL/PLATELET - Abnormal; Notable for the following components:      Result Value   WBC 16.2 (*)    Neutro Abs 13.4 (*)    Monocytes Absolute 1.1 (*)    All other components within normal limits  COMPREHENSIVE METABOLIC PANEL - Abnormal; Notable for the following components:   Sodium 130 (*)    Chloride 97 (*)    Glucose, Bld 113 (*)    AST 68 (*)    ALT 157 (*)    All other components within  normal limits  RESP PANEL BY RT-PCR (RSV, FLU A&B, COVID)  RVPGX2  CULTURE, BLOOD (ROUTINE X 2)  CULTURE, BLOOD (ROUTINE X 2)  I-STAT CG4 LACTIC ACID, ED    EKG None  Radiology CT HEAD W & WO CONTRAST ( ) Result Date: 05/15/2023 CLINICAL DATA:  Sudden severe headache. Recent Chiari malformation repair EXAM: CT HEAD WITHOUT AND WITH CONTRAST TECHNIQUE: Contiguous axial images were obtained from the base of the skull through the vertex without and with intravenous contrast. RADIATION DOSE REDUCTION: This exam was performed according to the departmental dose-optimization program which includes automated exposure control, adjustment of the mA and/or kV according to patient size and/or use of iterative reconstruction technique. CONTRAST:  75mL OMNIPAQUE IOHEXOL 350 MG/ML SOLN COMPARISON:  03/09/2023 FINDINGS: Brain: No mass,hemorrhage or extra-axial collection. Normal appearance of the parenchyma and CSF spaces. No abnormal contrast enhancement. Vascular: No hyperdense vessel or unexpected vascular calcification. Skull: Status post suboccipital craniectomy for Chiari decompression. Fluid collection at the suboccipital scalp measures 5.0 x 4.0 cm. Sinuses/Orbits: No fluid levels or advanced mucosal thickening of the visualized paranasal sinuses. No mastoid or middle ear effusion. Normal orbits. Other: None. IMPRESSION: 1. No acute intracranial abnormality. 2. Status post suboccipital craniectomy for Chiari decompression. Fluid collection at the suboccipital scalp measures 5.0 x 4.0 cm, likely a postoperative seroma or pseudomeningocele. Electronically Signed   By: Deatra Robinson M.D.   On: 05/15/2023 02:10    Procedures Procedures    Medications Ordered in ED Medications  diphenhydrAMINE (BENADRYL) injection 25 mg (25 mg Intravenous Given 05/15/23 0053)  metoCLOPramide (REGLAN) injection 10 mg (10 mg Intravenous Given 05/15/23 0054)  iohexol (OMNIPAQUE) 350 MG/ML injection 75 mL (75 mLs  Intravenous Contrast Given 05/15/23 0138)  ketorolac (TORADOL) 30 MG/ML injection 30 mg (30 mg Intravenous Given 05/15/23 0308)  prochlorperazine (COMPAZINE) injection 10 mg (10 mg Intravenous Given 05/15/23 0307)    ED Course/ Medical Decision Making/ A&P                                 Medical Decision Making Amount and/or Complexity of Data Reviewed Labs: ordered. Radiology: ordered and independent interpretation performed. ECG/medicine tests: ordered and independent interpretation performed.  Risk Prescription drug management.   27 year old male presenting to the ED with headache.  Began yesterday at 11 AM, progressively worsening.  Did have some nausea and vomiting yesterday.  Temp 99 here, mildly tachycardic.  BP is stable.  He does not have any focal neurologic deficits on exam.  Incision appears to be healing well, does seem to have some  puffiness beneath the incision.  Significant other reported some drainage from superior portion of the wound, I do not appreciate any of this on exam and nothing is expressed when pressure applied.  Given his surgery, will obtain CT head with and without contrast to evaluate for potential postop complication/infection.  Will also obtain labs, RVP as he is having some upper respiratory symptoms as well.  2:30 AM Labs with leukocytosis but normal lactate.  No electrolyte derangement.  RVP is negative.  Headache about the same after migraine cocktail.  CT with findings of possible seroma vs pseudomeningocele.  Given his symptoms of headache which is worse with position changes, vomiting, etc. I favor pseudomeningocele.  Will discuss with neurosurgery.  3:00 AM Spoke with on call neurosurgery, Dr. Danielle Dess-- does not feel further imaging or admission warranted at this time.  Conservative management for now, follow-up with Dr. Conchita Paris in office on Monday.  4:10 AM Patient and significant other report they are ready to go.  He states headache is not any  better but he was able to drift off to sleep for a bit.  BP has downtrended here, HR back to WNL.  He remains without any focal neurologic deficits.  Feel he is stable for discharge with plan as per neurosurgery.  He is aware to call office on Monday so they can squeeze him into see Dr. Conchita Paris.  Can return here for any new or acute changes.  Final Clinical Impression(s) / ED Diagnoses Final diagnoses:  Bad headache    Rx / DC Orders ED Discharge Orders     None         Garlon Hatchet, PA-C 05/15/23 0556    Nira Conn, MD 05/15/23 [phone]

## 2023-05-15 NOTE — Discharge Instructions (Addendum)
As we discussed, there is a fluid collection noted on your CT scan.  Neurosurgery did not recommend further testing today but they do want you to follow-up with Dr. Conchita Paris in clinic on Monday. Sure to rest and hydrate.  Can continue Tylenol or Motrin as needed for headache. Return to the ED for new or worsening symptoms-- severe neck pain, confusion, fever, numbness, weakness.

## 2023-05-18 ENCOUNTER — Ambulatory Visit: Payer: Medicaid Other | Admitting: Physical Therapy

## 2023-05-20 LAB — CULTURE, BLOOD (ROUTINE X 2)
Culture: NO GROWTH
Culture: NO GROWTH
Special Requests: ADEQUATE
Special Requests: ADEQUATE

## 2023-05-21 ENCOUNTER — Ambulatory Visit: Payer: Medicaid Other

## 2023-05-21 ENCOUNTER — Other Ambulatory Visit: Payer: Self-pay

## 2023-05-21 DIAGNOSIS — R262 Difficulty in walking, not elsewhere classified: Secondary | ICD-10-CM

## 2023-05-21 DIAGNOSIS — M6281 Muscle weakness (generalized): Secondary | ICD-10-CM

## 2023-05-21 DIAGNOSIS — R278 Other lack of coordination: Secondary | ICD-10-CM

## 2023-05-21 DIAGNOSIS — R2689 Other abnormalities of gait and mobility: Secondary | ICD-10-CM

## 2023-05-21 DIAGNOSIS — R41842 Visuospatial deficit: Secondary | ICD-10-CM

## 2023-05-21 DIAGNOSIS — R2681 Unsteadiness on feet: Secondary | ICD-10-CM

## 2023-05-21 NOTE — Therapy (Signed)
OUTPATIENT PHYSICAL THERAPY NEURO TREATMENT   Patient Name: Todd Mcintyre MRN: [id number] DOB:1996/05/01, 27 y.o., male Today's Date: 05/21/2023   PCP: no REFERRING PROVIDER: Lisbeth Renshaw, MD  END OF SESSION:  PT End of Session - 05/21/23 0858     Visit Number 3    Date for PT Re-Evaluation 07/28/23    Progress Note Due on Visit 10    PT Start Time 0854    PT Stop Time 0937    PT Time Calculation (min) 43 min    Activity Tolerance Patient tolerated treatment well    Behavior During Therapy [facility] for tasks assessed/performed               Past Medical History:  Diagnosis Date   Concussion without loss of consciousness 02/06/2013   Neuromuscular disorder (HCC) 03/09/2023   Hx -facial numbness and left arm numbness   Syrinx of spinal cord (HCC) 03/09/2023   Past Surgical History:  Procedure Laterality Date   ADENOIDECTOMY     LAPAROSCOPIC APPENDECTOMY N/A 06/11/2014   Procedure: APPENDECTOMY LAPAROSCOPIC;  Surgeon: Atilano Ina, MD;  Location: [facility] OR;  Service: General;  Laterality: N/A;   SUBOCCIPITAL CRANIECTOMY CERVICAL LAMINECTOMY N/A 04/06/2023   Procedure: CHIARI DECOMPRESSION;  Surgeon: Lisbeth Renshaw, MD;  Location: [facility] OR;  Service: Neurosurgery;  Laterality: N/A;   TONSILLECTOMY     Patient Active Problem List   Diagnosis Date Noted   Chiari malformation type I (HCC) 04/06/2023   Chiari I malformation (HCC) 04/06/2023   Visit for well man health check 10/22/2016   Screening examination for STD (sexually transmitted disease) 10/22/2016    ONSET DATE: 04/06/23  REFERRING DIAG: Chiari decompression  THERAPY DIAG:  Other lack of coordination  Visuospatial deficit  Muscle weakness (generalized)  Unsteadiness on feet  Imbalance  Difficulty in walking, not elsewhere classified  Rationale for Evaluation and Treatment: Rehabilitation  SUBJECTIVE:                                                                                                                                                                                              SUBJECTIVE STATEMENT: Went to ER last week, to see neurologist on Monday.  Have had a headache and neck stiffness, difficulty turning head for several days, can't sleep. No falls, still stumble.  Pt accompanied by: significant other  PERTINENT HISTORY: progression of double vision, coordination deficits, underwent emergent surgery  PAIN:  Are you having pain? No  PRECAUTIONS: None  RED FLAGS: None   WEIGHT BEARING RESTRICTIONS: No  FALLS: Has patient fallen in last 6 months? No  LIVING ENVIRONMENT: Lives  with: lives with their partner, lives with their son, and lives with their daughter, 68 and 77 y.o children Lives in: House/apartment Stairs: Yes: Internal: 11 steps; on right going up Has following equipment at home: None  PLOF: Independent, worked as Paediatric nurse  PATIENT GOALS: return to I life  OBJECTIVE:  Note: Objective measures were completed at Evaluation unless otherwise noted.  DIAGNOSTIC FINDINGS: na  COGNITION: Overall cognitive status: Within functional limits for tasks assessed   SENSATION: WFL  COORDINATION: Delayed movements, decreased coordination L LE  EDEMA:  none   POSTURE: some weight bearing B forefeet  LOWER EXTREMITY ROM:  wfl B LE's    LOWER EXTREMITY MMT:  MMT LE's wfl    GAIT: Gait pattern: decreased heel strike L, shortened stride length B, some circumduction L LE  FUNCTIONAL TESTS:  Berg Balance Scale: 46/56    TODAY'S TREATMENT:                                                                                                                              DATE: 05/21/23:  Lora Paula, Ue's and LE's level 4 for 4 min In ll bars for tandem gait on airex balance beam In ll bars for heel/toe rocks on airex, unable to perform consistently without UE support Tandem standing, 30 sec holds alternating lead foot Alt toe taps to 1/2 ball 1  min On rocker board, side to side rocking and for/ back rocking, noted some reflexive tremor/clonus with forward motion  Leg press, 80#, B LE's, 30 reps Ankle pumps 80#, B LE's 25 reps  05/07/23 NuStep L5x41mins  [employer] taps minA  Side steps on airex minA  Tandem standing in bars 30s SLS in bars 10s Standing on airex head turns  Standing on airex reaching for numbers on wall  Balance on BOSU  STS on airex 2x10 Shoulder ext 5# x10, 10# x10   05/05/23:   Initiated neuromuscular re education activities, including: Parallel bars for rocker board side to side and for/back Parallel bars heel /toe rocks on [employer] tandem standing lead foot on airex , with horizontal head turns Alt toe taps to 4" step, tried to [employer] of 60 BPM   PATIENT EDUCATION: Education details: POC, goals Person educated: Patient Education method: Programmer, multimedia, Demonstration, Tactile cues, and Verbal cues Education comprehension: verbalized understanding, returned demonstration, and verbal cues required  HOME EXERCISE PROGRAM: Advised to try the standing with lead foot on step with head turns, for home  GOALS: Goals reviewed with patient? Yes  SHORT TERM GOALS: Target date: 2 weeks 05/19/23  Able to follow home program I Baseline: Goal status: INITIAL   LONG TERM GOALS: Target date: 07/28/23 12 weeks   Berg increase from 44/56 to 50/56 Baseline:  Goal status: INITIAL  2.  Able to unilaterally stand on each leg 30 sec which is normal value for balance Baseline:  Goal status: INITIAL  3.  Able to walk up/ down curbs,  stairs, uneven terrain I and without  loss of balance Baseline: needs one hand support for these activities Goal status: INITIAL  ASSESSMENT:  CLINICAL IMPRESSION: Patient is a 27 y.o. male who was participated today with  physical therapy due to recent decline in function with chiari malformation and decompression surgery 6 weeks ago. Presents with coordination and  balance deficits, also double vision influencing his balance. He does have a new complaint of headache with difficutly turning head and radiating into lower back, has been to ER and to follow up with neurology on Monday.  Advised him that if his Sx worsen over the weekend to seek medical attention. He should benefit from skilled PT to address his deficits and assist him with improving his function.   OBJECTIVE IMPAIRMENTS: Abnormal gait, decreased balance, decreased coordination, and difficulty walking.   ACTIVITY LIMITATIONS: carrying, lifting, and standing  PARTICIPATION LIMITATIONS: meal prep, cleaning, laundry, community activity, occupation, and yard work  PERSONAL FACTORS: Education, Past/current experiences, and Transportation are also affecting patient's functional outcome.   REHAB POTENTIAL: Good  CLINICAL DECISION MAKING: Evolving/moderate complexity  EVALUATION COMPLEXITY: Moderate  PLAN:  PT FREQUENCY: 2x/week  PT DURATION: 12 weeks  PLANNED INTERVENTIONS: 97110-Therapeutic exercises, 97530- Therapeutic activity, 97112- Neuromuscular re-education, 97535- Self Care, and 40981- Manual therapy  PLAN FOR NEXT SESSION: progress with standing balance, coordination activities and multitasking skills    Zahraa Bhargava L Reinhardt Licausi, PT, DPT, OCS 05/21/2023, 9:47 AM

## 2023-05-24 ENCOUNTER — Ambulatory Visit: Payer: Medicaid Other

## 2023-05-24 DIAGNOSIS — R2689 Other abnormalities of gait and mobility: Secondary | ICD-10-CM

## 2023-05-24 DIAGNOSIS — M6281 Muscle weakness (generalized): Secondary | ICD-10-CM

## 2023-05-24 DIAGNOSIS — R262 Difficulty in walking, not elsewhere classified: Secondary | ICD-10-CM

## 2023-05-24 DIAGNOSIS — R278 Other lack of coordination: Secondary | ICD-10-CM

## 2023-05-24 DIAGNOSIS — R41842 Visuospatial deficit: Secondary | ICD-10-CM | POA: Diagnosis not present

## 2023-05-24 DIAGNOSIS — R2681 Unsteadiness on feet: Secondary | ICD-10-CM

## 2023-05-24 NOTE — Therapy (Signed)
OUTPATIENT PHYSICAL THERAPY NEURO TREATMENT   Patient Name: Todd Mcintyre MRN: [id number] DOB:1996-05-07, 27 y.o., male Today's Date: 05/24/2023   PCP: no REFERRING PROVIDER: Lisbeth Renshaw, MD  END OF SESSION:  PT End of Session - 05/24/23 1536     Visit Number 4    Date for PT Re-Evaluation 07/28/23    Progress Note Due on Visit 10    PT Start Time 1535    PT Stop Time 1615    PT Time Calculation (min) 40 min    Activity Tolerance Patient tolerated treatment well    Behavior During Therapy [facility] for tasks assessed/performed                Past Medical History:  Diagnosis Date   Concussion without loss of consciousness 02/06/2013   Neuromuscular disorder (HCC) 03/09/2023   Hx -facial numbness and left arm numbness   Syrinx of spinal cord (HCC) 03/09/2023   Past Surgical History:  Procedure Laterality Date   ADENOIDECTOMY     LAPAROSCOPIC APPENDECTOMY N/A 06/11/2014   Procedure: APPENDECTOMY LAPAROSCOPIC;  Surgeon: Atilano Ina, MD;  Location: [facility] OR;  Service: General;  Laterality: N/A;   SUBOCCIPITAL CRANIECTOMY CERVICAL LAMINECTOMY N/A 04/06/2023   Procedure: CHIARI DECOMPRESSION;  Surgeon: Lisbeth Renshaw, MD;  Location: [facility] OR;  Service: Neurosurgery;  Laterality: N/A;   TONSILLECTOMY     Patient Active Problem List   Diagnosis Date Noted   Chiari malformation type I (HCC) 04/06/2023   Chiari I malformation (HCC) 04/06/2023   Visit for well man health check 10/22/2016   Screening examination for STD (sexually transmitted disease) 10/22/2016    ONSET DATE: 04/06/23  REFERRING DIAG: Chiari decompression  THERAPY DIAG:  Other lack of coordination  Visuospatial deficit  Muscle weakness (generalized)  Unsteadiness on feet  Imbalance  Difficulty in walking, not elsewhere classified  Other abnormalities of gait and mobility  Rationale for Evaluation and Treatment: Rehabilitation  SUBJECTIVE:                                                                                                                                                                                              SUBJECTIVE STATEMENT: Headaches still happening but not as bad. Neuro told me they can't see me until Jan 20th.  Pt accompanied by: significant other  PERTINENT HISTORY: progression of double vision, coordination deficits, underwent emergent surgery  PAIN:  Are you having pain? No  PRECAUTIONS: None  RED FLAGS: None   WEIGHT BEARING RESTRICTIONS: No  FALLS: Has patient fallen in last 6 months? No  LIVING ENVIRONMENT: Lives with: lives with their partner,  lives with their son, and lives with their daughter, 34 and 107 y.o children Lives in: House/apartment Stairs: Yes: Internal: 11 steps; on right going up Has following equipment at home: None  PLOF: Independent, worked as Paediatric nurse  PATIENT GOALS: return to I life  OBJECTIVE:  Note: Objective measures were completed at Evaluation unless otherwise noted.  DIAGNOSTIC FINDINGS: na  COGNITION: Overall cognitive status: Within functional limits for tasks assessed   SENSATION: WFL  COORDINATION: Delayed movements, decreased coordination L LE  EDEMA:  none   POSTURE: some weight bearing B forefeet  LOWER EXTREMITY ROM:  wfl B LE's    LOWER EXTREMITY MMT:  MMT LE's wfl    GAIT: Gait pattern: decreased heel strike L, shortened stride length B, some circumduction L LE  FUNCTIONAL TESTS:  Berg Balance Scale: 46/56    TODAY'S TREATMENT:                                                                                                                              DATE: 05/24/23 NuStep L5 x25mins  Resisted gait 30# 4 way x4  STS on airex with OHP yellow ball 2x10 Step ups from airex to 6" minA x5 each leg On airex volleyball hits Side steps on to airex    05/21/23:  Nustep, Ue's and LE's level 4 for 4 min In ll bars for tandem gait on airex balance beam In ll bars  for heel/toe rocks on airex, unable to perform consistently without UE support Tandem standing, 30 sec holds alternating lead foot Alt toe taps to 1/2 ball 1 min On rocker board, side to side rocking and for/ back rocking, noted some reflexive tremor/clonus with forward motion Leg press, 80#, B LE's, 30 reps Ankle pumps 80#, B LE's 25 reps  05/07/23 NuStep L5x55mins  [employer] taps minA  Side steps on airex minA  Tandem standing in bars 30s SLS in bars 10s Standing on airex head turns  Standing on airex reaching for numbers on wall  Balance on BOSU, mini squats 2x10 STS on airex 2x10 Shoulder ext 5# x10, 10# x10   05/05/23:   Initiated neuromuscular re education activities, including: Parallel bars for rocker board side to side and for/back Parallel bars heel /toe rocks on [employer] tandem standing lead foot on airex , with horizontal head turns Alt toe taps to 4" step, tried to [employer] of 60 BPM   PATIENT EDUCATION: Education details: POC, goals Person educated: Patient Education method: Programmer, multimedia, Demonstration, Tactile cues, and Verbal cues Education comprehension: verbalized understanding, returned demonstration, and verbal cues required  HOME EXERCISE PROGRAM: Advised to try the standing with lead foot on step with head turns, for home  GOALS: Goals reviewed with patient? Yes  SHORT TERM GOALS: Target date: 2 weeks 05/19/23  Able to follow home program I Baseline: Goal status: INITIAL   LONG TERM GOALS: Target date: 07/28/23 12 weeks   Berg increase from 44/56 to  50/56 Baseline:  Goal status: INITIAL  2.  Able to unilaterally stand on each leg 30 sec which is normal value for balance Baseline:  Goal status: INITIAL  3.  Able to walk up/ down curbs, stairs, uneven terrain I and without  loss of balance Baseline: needs one hand support for these activities Goal status: INITIAL  ASSESSMENT:  CLINICAL IMPRESSION: Patient is a 27 y.o. male who was  participated today with  physical therapy due to recent decline in function with chiari malformation and decompression surgery 6 weeks ago. Presents with coordination and balance deficits, also double vision influencing his balance. Has some unsteadiness and LOB with resisted gait, CGA-minA needed to prevent falling. Cues needed to keep feet pointed forwards and not turned out. Still presents with LUE weakness. minA needed with step ups from airex, difficulty may be from visual impairment as well as coordination deficits. Reports a slight headache in session today, says he normally takes a Tylenol before coming but did not today. He should benefit from skilled PT to address his deficits and assist him with improving his function.   OBJECTIVE IMPAIRMENTS: Abnormal gait, decreased balance, decreased coordination, and difficulty walking.   ACTIVITY LIMITATIONS: carrying, lifting, and standing  PARTICIPATION LIMITATIONS: meal prep, cleaning, laundry, community activity, occupation, and yard work  PERSONAL FACTORS: Education, Past/current experiences, and Transportation are also affecting patient's functional outcome.   REHAB POTENTIAL: Good  CLINICAL DECISION MAKING: Evolving/moderate complexity  EVALUATION COMPLEXITY: Moderate  PLAN:  PT FREQUENCY: 2x/week  PT DURATION: 12 weeks  PLANNED INTERVENTIONS: 97110-Therapeutic exercises, 97530- Therapeutic activity, 97112- Neuromuscular re-education, 97535- Self Care, and 40981- Manual therapy  PLAN FOR NEXT SESSION: progress with standing balance, coordination activities and multitasking skills    Cassie Freer, PT, DPT, OCS 05/24/2023, 4:17 PM

## 2023-05-25 ENCOUNTER — Ambulatory Visit: Payer: Medicaid Other | Admitting: Physical Therapy

## 2023-05-25 DIAGNOSIS — R262 Difficulty in walking, not elsewhere classified: Secondary | ICD-10-CM

## 2023-05-25 DIAGNOSIS — R2681 Unsteadiness on feet: Secondary | ICD-10-CM

## 2023-05-25 DIAGNOSIS — R2689 Other abnormalities of gait and mobility: Secondary | ICD-10-CM

## 2023-05-25 DIAGNOSIS — R278 Other lack of coordination: Secondary | ICD-10-CM

## 2023-05-25 DIAGNOSIS — R41842 Visuospatial deficit: Secondary | ICD-10-CM | POA: Diagnosis not present

## 2023-05-25 DIAGNOSIS — M6281 Muscle weakness (generalized): Secondary | ICD-10-CM

## 2023-05-25 NOTE — Therapy (Signed)
 OUTPATIENT PHYSICAL THERAPY NEURO TREATMENT   Patient Name: Todd Mcintyre MRN: [id number] DOB:[birth year], 27 y.o., male Today's Date: 05/25/2023   PCP: no REFERRING PROVIDER: Lanis Pupa, MD  END OF SESSION:  PT End of Session - 05/25/23 1105     Visit Number 5    Date for PT Re-Evaluation 07/28/23    Progress Note Due on Visit 10    PT Start Time 1105    PT Stop Time 1145    PT Time Calculation (min) 40 min    Activity Tolerance Patient tolerated treatment well    Behavior During Therapy [facility] for tasks assessed/performed                 Past Medical History:  Diagnosis Date   Concussion without loss of consciousness 02/06/2013   Neuromuscular disorder (HCC) 03/09/2023   Hx -facial numbness and left arm numbness   Syrinx of spinal cord (HCC) 03/09/2023   Past Surgical History:  Procedure Laterality Date   ADENOIDECTOMY     LAPAROSCOPIC APPENDECTOMY N/A 06/11/2014   Procedure: APPENDECTOMY LAPAROSCOPIC;  Surgeon: Camellia CHRISTELLA Blush, MD;  Location: [facility] OR;  Service: General;  Laterality: N/A;   SUBOCCIPITAL CRANIECTOMY CERVICAL LAMINECTOMY N/A 04/06/2023   Procedure: CHIARI DECOMPRESSION;  Surgeon: Lanis Pupa, MD;  Location: [facility] OR;  Service: Neurosurgery;  Laterality: N/A;   TONSILLECTOMY     Patient Active Problem List   Diagnosis Date Noted   Chiari malformation type I (HCC) 04/06/2023   Chiari I malformation (HCC) 04/06/2023   Visit for well man health check 10/22/2016   Screening examination for STD (sexually transmitted disease) 10/22/2016    ONSET DATE: 04/06/23  REFERRING DIAG: Chiari decompression  THERAPY DIAG:  Other lack of coordination  Muscle weakness (generalized)  Unsteadiness on feet  Imbalance  Difficulty in walking, not elsewhere classified  Other abnormalities of gait and mobility  Rationale for Evaluation and Treatment: Rehabilitation  SUBJECTIVE:                                                                                                                                                                                              SUBJECTIVE STATEMENT: Pt reports no headache last night and today.   Pt accompanied by: significant other  PERTINENT HISTORY: progression of double vision, coordination deficits, underwent emergent surgery  PAIN:  Are you having pain? No  PRECAUTIONS: None  RED FLAGS: None   WEIGHT BEARING RESTRICTIONS: No  FALLS: Has patient fallen in last 6 months? No  LIVING ENVIRONMENT: Lives with: lives with their partner, lives with their son, and lives with their daughter, 3  and 3 y.o children Lives in: House/apartment Stairs: Yes: Internal: 11 steps; on right going up Has following equipment at home: None  PLOF: Independent, worked as Paediatric Nurse  PATIENT GOALS: return to I life  OBJECTIVE:  Note: Objective measures were completed at Evaluation unless otherwise noted.  COORDINATION: Delayed movements, decreased coordination L LE  POSTURE: some weight bearing B forefeet  LOWER EXTREMITY ROM:  wfl B LE's   LOWER EXTREMITY MMT:  MMT LE's wfl   GAIT: Gait pattern: decreased heel strike L, shortened stride length B, some circumduction L LE  FUNCTIONAL TESTS:  Berg Balance Scale: 46/56    TODAY'S TREATMENT:                                                                                                                              DATE: 05/25/23 Supine with feet on pball  Pball flexion + PPT 2x10  Bridging 2x10  Ball squeeze between feet 2x10  SAQ with ball squeeze between feet 2x10 Supine hip abd red TB around feet 2x10 Standing at counter (min A throughout for minor LOBs)  One foot forward on dynadisk balance with diagonal chops yellow weighted ball 2x10  One foot side step on dynadisk, static balance in side lunge 2x30  Step back/fwd rock 2x10 on L only  Amb with heel/toe and cues to keep hips forward x2 laps, min A for minor  LOBs    05/24/23 NuStep L5 x50mins  Resisted gait 30# 4 way x4  STS on airex with OHP yellow ball 2x10 Step ups from airex to 6 minA x5 each leg On airex volleyball hits Side steps on to airex    05/21/23:  Nustep, Ue's and LE's level 4 for 4 min In ll bars for tandem gait on airex balance beam In ll bars for heel/toe rocks on airex, unable to perform consistently without UE support Tandem standing, 30 sec holds alternating lead foot Alt toe taps to 1/2 ball 1 min On rocker board, side to side rocking and for/ back rocking, noted some reflexive tremor/clonus with forward motion Leg press, 80#, B LE's, 30 reps Ankle pumps 80#, B LE's 25 reps  05/07/23 NuStep L5x89mins  [employer] taps minA  Side steps on airex minA  Tandem standing in bars 30s SLS in bars 10s Standing on airex head turns  Standing on airex reaching for numbers on wall  Balance on BOSU, mini squats 2x10 STS on airex 2x10 Shoulder ext 5# x10, 10# x10   05/05/23:   Initiated neuromuscular re education activities, including: Parallel bars for rocker board side to side and for/back Parallel bars heel /toe rocks on [employer] tandem standing lead foot on airex , with horizontal head turns Alt toe taps to 4 step, tried to united stationers of 60 BPM   PATIENT EDUCATION: Education details: POC, goals Person educated: Patient Education method: Programmer, Multimedia, Demonstration, Actor cues, and Verbal cues Education comprehension: verbalized understanding, returned demonstration, and verbal  cues required  HOME EXERCISE PROGRAM: Advised to try the standing with lead foot on step with head turns, for home  GOALS: Goals reviewed with patient? Yes  SHORT TERM GOALS: Target date: 2 weeks 05/19/23  Able to follow home program I Baseline: Goal status: INITIAL   LONG TERM GOALS: Target date: 07/28/23 12 weeks   Berg increase from 44/56 to 50/56 Baseline:  Goal status: INITIAL  2.  Able to unilaterally stand on each  leg 30 sec which is normal value for balance Baseline:  Goal status: INITIAL  3.  Able to walk up/ down curbs, stairs, uneven terrain I and without  loss of balance Baseline: needs one hand support for these activities Goal status: INITIAL  ASSESSMENT:  CLINICAL IMPRESSION: Patient is a 27 y.o. male who was participated today with  physical therapy due to recent decline in function with chiari malformation and decompression surgery 6 weeks ago. Presents with coordination and balance deficits, also double vision influencing his balance. Treatment session today focused on gross L side strengthening. Working on increasing weight bearing/weight shift to L with more stability.    OBJECTIVE IMPAIRMENTS: Abnormal gait, decreased balance, decreased coordination, and difficulty walking.    PLAN:  PT FREQUENCY: 2x/week  PT DURATION: 12 weeks  PLANNED INTERVENTIONS: 97110-Therapeutic exercises, 97530- Therapeutic activity, 97112- Neuromuscular re-education, 97535- Self Care, and 02859- Manual therapy  PLAN FOR NEXT SESSION: progress with standing balance, coordination activities and multitasking skills    A[place] Lebeau April Ma L Treyshawn Muldrew, PT, DPT 05/25/2023, 11:05 AM

## 2023-05-27 ENCOUNTER — Ambulatory Visit: Payer: Medicaid Other | Attending: Neurosurgery | Admitting: Occupational Therapy

## 2023-05-27 ENCOUNTER — Ambulatory Visit: Payer: Medicaid Other | Admitting: Physical Therapy

## 2023-05-27 DIAGNOSIS — R208 Other disturbances of skin sensation: Secondary | ICD-10-CM | POA: Insufficient documentation

## 2023-05-27 DIAGNOSIS — R41842 Visuospatial deficit: Secondary | ICD-10-CM | POA: Insufficient documentation

## 2023-05-27 DIAGNOSIS — R262 Difficulty in walking, not elsewhere classified: Secondary | ICD-10-CM | POA: Insufficient documentation

## 2023-05-27 DIAGNOSIS — R2681 Unsteadiness on feet: Secondary | ICD-10-CM | POA: Insufficient documentation

## 2023-05-27 DIAGNOSIS — R2689 Other abnormalities of gait and mobility: Secondary | ICD-10-CM | POA: Insufficient documentation

## 2023-05-27 DIAGNOSIS — M6281 Muscle weakness (generalized): Secondary | ICD-10-CM | POA: Insufficient documentation

## 2023-05-27 DIAGNOSIS — R278 Other lack of coordination: Secondary | ICD-10-CM | POA: Insufficient documentation

## 2023-05-27 NOTE — Therapy (Deleted)
 OUTPATIENT PHYSICAL THERAPY NEURO TREATMENT   Patient Name: Todd Mcintyre MRN: [id number] DOB:1996-01-09, 28 y.o., male Today's Date: 05/27/2023   PCP: no REFERRING PROVIDER: Lanis Pupa, MD  END OF SESSION:        Past Medical History:  Diagnosis Date   Concussion without loss of consciousness 02/06/2013   Neuromuscular disorder (HCC) 03/09/2023   Hx -facial numbness and left arm numbness   Syrinx of spinal cord (HCC) 03/09/2023   Past Surgical History:  Procedure Laterality Date   ADENOIDECTOMY     LAPAROSCOPIC APPENDECTOMY N/A 06/11/2014   Procedure: APPENDECTOMY LAPAROSCOPIC;  Surgeon: Camellia CHRISTELLA Blush, MD;  Location: [facility] OR;  Service: General;  Laterality: N/A;   SUBOCCIPITAL CRANIECTOMY CERVICAL LAMINECTOMY N/A 04/06/2023   Procedure: CHIARI DECOMPRESSION;  Surgeon: Lanis Pupa, MD;  Location: [facility] OR;  Service: Neurosurgery;  Laterality: N/A;   TONSILLECTOMY     Patient Active Problem List   Diagnosis Date Noted   Chiari malformation type I (HCC) 04/06/2023   Chiari I malformation (HCC) 04/06/2023   Visit for well man health check 10/22/2016   Screening examination for STD (sexually transmitted disease) 10/22/2016    ONSET DATE: 04/06/23  REFERRING DIAG: Chiari decompression  THERAPY DIAG:  No diagnosis found.  Rationale for Evaluation and Treatment: Rehabilitation  SUBJECTIVE:                                                                                                                                                                                             SUBJECTIVE STATEMENT: *** Pt reports no headache last night and today.   Pt accompanied by: significant other  PERTINENT HISTORY: progression of double vision, coordination deficits, underwent emergent surgery  PAIN:  Are you having pain? No  PRECAUTIONS: None  RED FLAGS: None   WEIGHT BEARING RESTRICTIONS: No  FALLS: Has patient fallen in last 6 months? No  LIVING  ENVIRONMENT: Lives with: lives with their partner, lives with their son, and lives with their daughter, 67 and 3 y.o children Lives in: House/apartment Stairs: Yes: Internal: 11 steps; on right going up Has following equipment at home: None  PLOF: Independent, worked as Paediatric Nurse  PATIENT GOALS: return to I life  OBJECTIVE:  Note: Objective measures were completed at Evaluation unless otherwise noted.  COORDINATION: Delayed movements, decreased coordination L LE  POSTURE: some weight bearing B forefeet  LOWER EXTREMITY ROM:  wfl B LE's   LOWER EXTREMITY MMT:  MMT LE's wfl   GAIT: Gait pattern: decreased heel strike L, shortened stride length B, some circumduction L LE  FUNCTIONAL TESTS:  Berg Balance Scale: 46/56  TODAY'S TREATMENT:                                                                                                                              DATE: 05/27/23 ***  05/25/23 Supine with feet on pball  Pball flexion + PPT 2x10  Bridging 2x10  Ball squeeze between feet 2x10  SAQ with ball squeeze between feet 2x10 Supine hip abd red TB around feet 2x10 Standing at counter (min A throughout for minor LOBs)  One foot forward on dynadisk balance with diagonal chops yellow weighted ball 2x10  One foot side step on dynadisk, static balance in side lunge 2x30  Step back/fwd rock 2x10 on L only  Amb with heel/toe and cues to keep hips forward x2 laps, min A for minor LOBs    05/24/23 NuStep L5 x74mins  Resisted gait 30# 4 way x4  STS on airex with OHP yellow ball 2x10 Step ups from airex to 6 minA x5 each leg On airex volleyball hits Side steps on to airex    05/21/23:  Nustep, Ue's and LE's level 4 for 4 min In ll bars for tandem gait on airex balance beam In ll bars for heel/toe rocks on airex, unable to perform consistently without UE support Tandem standing, 30 sec holds alternating lead foot Alt toe taps to 1/2 ball 1 min On rocker board, side to side  rocking and for/ back rocking, noted some reflexive tremor/clonus with forward motion Leg press, 80#, B LE's, 30 reps Ankle pumps 80#, B LE's 25 reps  05/07/23 NuStep L5x32mins  [employer] taps minA  Side steps on airex minA  Tandem standing in bars 30s SLS in bars 10s Standing on airex head turns  Standing on airex reaching for numbers on wall  Balance on BOSU, mini squats 2x10 STS on airex 2x10 Shoulder ext 5# x10, 10# x10   05/05/23:   Initiated neuromuscular re education activities, including: Parallel bars for rocker board side to side and for/back Parallel bars heel /toe rocks on [employer] tandem standing lead foot on airex , with horizontal head turns Alt toe taps to 4 step, tried to united stationers of 60 BPM   PATIENT EDUCATION: Education details: POC, goals Person educated: Patient Education method: Programmer, Multimedia, Demonstration, Tactile cues, and Verbal cues Education comprehension: verbalized understanding, returned demonstration, and verbal cues required  HOME EXERCISE PROGRAM: Advised to try the standing with lead foot on step with head turns, for home  GOALS: Goals reviewed with patient? Yes  SHORT TERM GOALS: Target date: 2 weeks 05/19/23  Able to follow home program I Baseline: Goal status: INITIAL   LONG TERM GOALS: Target date: 07/28/23 12 weeks   Berg increase from 44/56 to 50/56 Baseline:  Goal status: INITIAL  2.  Able to unilaterally stand on each leg 30 sec which is normal value for balance Baseline:  Goal status: INITIAL  3.  Able to walk up/ down curbs, stairs, uneven  terrain I and without  loss of balance Baseline: needs one hand support for these activities Goal status: INITIAL  ASSESSMENT:  CLINICAL IMPRESSION: Patient is a 28 y.o. male who was participated today with  physical therapy due to recent decline in function with chiari malformation and decompression surgery 6 weeks ago. *** Presents with coordination and balance deficits, also  double vision influencing his balance. Treatment session today focused on gross L side strengthening. Working on increasing weight bearing/weight shift to L with more stability.    OBJECTIVE IMPAIRMENTS: Abnormal gait, decreased balance, decreased coordination, and difficulty walking.    PLAN:  PT FREQUENCY: 2x/week  PT DURATION: 12 weeks  PLANNED INTERVENTIONS: 97110-Therapeutic exercises, 97530- Therapeutic activity, 97112- Neuromuscular re-education, 97535- Self Care, and 02859- Manual therapy  PLAN FOR NEXT SESSION: progress with standing balance, coordination activities and multitasking skills    Moriyah Byington April Ma L Bessie Boyte, PT, DPT 05/27/2023, 7:57 AM

## 2023-05-31 ENCOUNTER — Ambulatory Visit: Payer: Medicaid Other | Admitting: Occupational Therapy

## 2023-05-31 ENCOUNTER — Ambulatory Visit: Payer: Medicaid Other

## 2023-06-01 NOTE — Therapy (Signed)
 OUTPATIENT OCCUPATIONAL THERAPY NEURO Treatment  Patient Name: TYLEEK SMICK MRN: [id number] DOB:18-Jun-1995, 28 y.o., male Today's Date: 06/02/2023  PCP: none REFERRING PROVIDER: Dr. Lanis  END OF SESSION:  OT End of Session - 06/02/23 1003     Visit Number 3    Number of Visits 18    Date for OT Re-Evaluation 07/28/23    Authorization Type [employer] M[employer], no auth required    Authorization Time Period 12 weeks    Authorization - Visit Number 1    Authorization - Number of Visits 13   27 per year   OT Start Time 0930    OT Stop Time 1013    OT Time Calculation (min) 43 min              Past Medical History:  Diagnosis Date   Concussion without loss of consciousness 02/06/2013   Neuromuscular disorder (HCC) 03/09/2023   Hx -facial numbness and left arm numbness   Syrinx of spinal cord (HCC) 03/09/2023   Past Surgical History:  Procedure Laterality Date   ADENOIDECTOMY     LAPAROSCOPIC APPENDECTOMY N/A 06/11/2014   Procedure: APPENDECTOMY LAPAROSCOPIC;  Surgeon: Camellia CHRISTELLA Blush, MD;  Location: [facility] OR;  Service: General;  Laterality: N/A;   SUBOCCIPITAL CRANIECTOMY CERVICAL LAMINECTOMY N/A 04/06/2023   Procedure: CHIARI DECOMPRESSION;  Surgeon: Lanis Pupa, MD;  Location: [facility] OR;  Service: Neurosurgery;  Laterality: N/A;   TONSILLECTOMY     Patient Active Problem List   Diagnosis Date Noted   Chiari malformation type I (HCC) 04/06/2023   Chiari I malformation (HCC) 04/06/2023   Visit for well man health check 10/22/2016   Screening examination for STD (sexually transmitted disease) 10/22/2016    ONSET DATE: 04/06/23  REFERRING DIAG: G93.5 Chiari malfrormation type I  THERAPY DIAG:  Other lack of coordination  Muscle weakness (generalized)  Unsteadiness on feet  Other abnormalities of gait and mobility  Visuospatial deficit  Other disturbances of skin sensation  Rationale for Evaluation and Treatment: Rehabilitation  SUBJECTIVE:   SUBJECTIVE  STATEMENT: Pt reports he  has had terrible headaches, and has fluid on his brin and that's why he missed therapy Pt accompanied by: significant other Recardo  PERTINENT HISTORY: Pt is 28 yo male who presents on 04/06/23 for Arnold-Chiari malformation repair (suboccipital craniectomy with expansile duraplasty with C1 laminectomy). PMH: appendectomy, tonsillectomy, several ED visits for facial numbness and L side numbness PTA pt lives independently with his girlfriend and 2 small children. 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 Yes: NPRS scale: 4/10 Pain location: LUE Pain description: numbness Aggravating factors: sleeping on L side Relieving factors: positioning  FALLS: Has patient fallen in last 6 months? No  LIVING ENVIRONMENT: Lives with: lives with an adult companion Lives in: House/apartment Stairs: Yes: Internal:  Has following equipment at home: None  PLOF: Independent  PATIENT GOALS: improve independence, improve vision and strength  OBJECTIVE:  Note: Objective measures were completed at Evaluation unless otherwise noted.  HAND DOMINANCE: Right  ADLs: Overall ADLs: mod I with all basic ADLs Transfers/ambulation related to ADLs:  UB Dressing: mod I LB Dressing: mod I Toileting: mod I Bathing: mod I Tub Shower transfers: mod I walkin shower  IADLs:  Light housekeeping: has done light tasks but not back to prior level Meal Prep: has cooked but  pt reports  left hand was on hot pan, cautioned against cooking due  to risk for injury Community mobility: mod I Medication management: Pt handles    MOBILITY STATUS: Independent    UPPER EXTREMITY ROM:  RUE WFLS  Active ROM Right eval Left eval  Shoulder flexion  120  Shoulder abduction    Shoulder adduction    Shoulder extension    Shoulder internal rotation    Shoulder external rotation    Elbow flexion  WFLs   Elbow extension  -10  Wrist flexion    Wrist extension    Wrist ulnar deviation    Wrist radial deviation    Wrist pronation    Wrist supination    (Blank rows = not tested)  UPPER EXTREMITY MMT:     MMT Right eval Left eval  Shoulder flexion 5/5 3+/5  Shoulder abduction    Shoulder adduction    Shoulder extension    Shoulder internal rotation    Shoulder external rotation    Middle trapezius    Lower trapezius    Elbow flexion 5/5 4-/5  Elbow extension 5/5 4-/5  Wrist flexion    Wrist extension    Wrist ulnar deviation    Wrist radial deviation    Wrist pronation    Wrist supination    (Blank rows = not tested)  HAND FUNCTION: Grip strength: Right: 107 lbs; Left: 53 lbs  COORDINATION: 9 Hole Peg test: Right: 33.30 sec; Left: 49.02 sec   SENSATION: Light touch: Impaired  for LUE, pt also reports impaired hot/ cold sensation   COGNITION: Overall cognitive status: Within functional limits for tasks assessed  VISION: Subjective report: Pt reports double vision Baseline vision: No visual deficits   VISION ASSESSMENT: Eye alignment: Impaired:  Gaze preference/alignment: WDL Tracking/Visual pursuits: Right eye does not track laterally Saccades: additional eye shifts occurred during testing Convergence: Impaired:  Diplopia assessment: objects splits side to side and present in primary gaze  Patient has difficulty with following activities due to following visual impairments: reading, pt arrived wearing eye patch on R eye. He reports that he has claer glasses with tape to help with diplopia at home.  PERCEPTION: Impaired: depth perception    OBSERVATIONS: Pleasant 28 y.o who was cooperative throughout eval accompanied by his GF   TODAY'S TREATMENT:                                                                                                                              DATE:06/02/23- Therapist reviewed attenance policy as pt has had several no shows. Pt  reports terrible headaches prevented him from attending. He was remineded to call to cx. Education regarding visual compensations and green putty HEP performed, see pt instructions. Copying small peg design withleft eye patched, 1 error and pt was able to self correct. UBE x 5 mins level 3 for condtioning.    05/06/23- see pt instructions/ education  05/05/23- eval   PATIENT EDUCATION: Education details:green putty HEP, visual compensations for safety Person educated: Patient  Education method: Explanation, demonstration, v.c, handout Education comprehension: verbalized understanding, returned demonstration, v.c  HOME EXERCISE PROGRAM: diplopia HEP,coordiantion HEP   GOALS: Goals reviewed with patient? Yes  SHORT TERM GOALS: Target date: 110/24  I with inital HEP. Baseline:dependent Goal status:  ongoing  2.   Pt will verbalize understanding of compensatory strategies for vision Baseline: dependent Goal status:  ongoing, issued 06/02/23  3.  Pt will demonstrate improved LUE fine motor coordiantion as evidenced by decreasing 9 hole peg test score to 45 secs or less Baseline: see above Goal status: INITIAL  4.  Pt will increase LUE grip strength to 60 lbs or better for improved LUE functional use. Baseline:see above  Goal status:  met, 85 lbs 06/02/23  5.  Pt will verbalize understanding of sensory precautions for LUE Baseline: dependent Goal status: ongoing    6 Pt will demonstrate ability to read a short paragraph without complaints of diplopia. Baseline: unable  Goal status:  ongoing  LONG TERM GOALS: Target date: 07/28/22  I with updated HEP Baseline: dependent Goal status: INITIAL  2.  Pt will demonstrate ability to read a  a page of newsprint without complaints of diplopia. Baseline: unable due to diplopia Goal status: INITIAL  3.  Pt will demonstrate improved LUE fine motor coordiantion as evidenced by decreasing 9 hole peg test score to 40 secs or  less Baseline:see above  Goal status: INITIAL  4.  Pt will increase LUE grip strength to 68 lbs or greater for increased functional use. Baseline: see above Goal status: INITIAL  5.  Pt will resume performance of cooking activities mod I with good safety awareness Baseline: currently pt has touched hot object with LUE while cooking due to decreased sensation Goal status: INITIAL  6.  Pt will resume performance of mod complex home management mod I Baseline: has not resumed prior level due to deficits. Goal status: INITIAL 7 Pt will perfrom simulated work activities mod I  Baseline:dependent  Goal status:inital  ASSESSMENT:  CLINICAL IMPRESSION:   Pt is progressing towards goals. He verbalizes understanding of visual compensations. He is already using tape or eye patch for diplopia. Pt reports fluid on his brain and he will see MD tomorrow. PERFORMANCE DEFICITS: in functional skills including ADLs, IADLs, coordination, dexterity, sensation, ROM, strength, Fine motor control, Gross motor control, mobility, balance, endurance, decreased knowledge of precautions, vision, and UE functional use,  and psychosocial skills including coping strategies, environmental adaptation, habits, interpersonal interactions, and routines and behaviors.   IMPAIRMENTS: are limiting patient from ADLs, IADLs, rest and sleep, work, play, leisure, and social participation.   CO-MORBIDITIES: may have co-morbidities  that affects occupational performance. Patient will benefit from skilled OT to address above impairments and improve overall function.  MODIFICATION OR ASSISTANCE TO COMPLETE EVALUATION: No modification of tasks or assist necessary to complete an evaluation.  OT OCCUPATIONAL PROFILE AND HISTORY: Detailed assessment: Review of records and additional review of physical, cognitive, psychosocial history related to current functional performance.  CLINICAL DECISION MAKING: LOW - limited treatment  options, no task modification necessary  REHAB POTENTIAL: Good  EVALUATION COMPLEXITY: Low    PLAN:  OT FREQUENCY:  17 visits plus eval  OT DURATION: 12 weeks  PLANNED INTERVENTIONS: 97168 OT Re-evaluation, 97535 self care/ADL training, 02889 therapeutic exercise, 97530 therapeutic activity, 97112 neuromuscular re-education, 97140 manual therapy, 97113 aquatic therapy, 97035 ultrasound, 97018 paraffin, 02989 moist heat, 97010 cryotherapy, 97034 contrast bath, passive range of motion, balance training, functional mobility training, visual/perceptual remediation/compensation,  energy conservation, coping strategies training, patient/family education, and DME and/or AE instructions  RECOMMENDED OTHER SERVICES: PT  CONSULTED AND AGREED WITH PLAN OF CARE: Patient and family member/caregiver- ELINORE Bread  PLAN FOR NEXT SESSION: theraband HEP   Bless Belshe, OT 06/02/2023, 10:04 AM

## 2023-06-02 ENCOUNTER — Ambulatory Visit: Payer: Medicaid Other

## 2023-06-02 ENCOUNTER — Other Ambulatory Visit: Payer: Self-pay

## 2023-06-02 ENCOUNTER — Ambulatory Visit: Payer: Medicaid Other | Admitting: Occupational Therapy

## 2023-06-02 DIAGNOSIS — R2689 Other abnormalities of gait and mobility: Secondary | ICD-10-CM

## 2023-06-02 DIAGNOSIS — R41842 Visuospatial deficit: Secondary | ICD-10-CM | POA: Diagnosis present

## 2023-06-02 DIAGNOSIS — R262 Difficulty in walking, not elsewhere classified: Secondary | ICD-10-CM

## 2023-06-02 DIAGNOSIS — R2681 Unsteadiness on feet: Secondary | ICD-10-CM

## 2023-06-02 DIAGNOSIS — M6281 Muscle weakness (generalized): Secondary | ICD-10-CM

## 2023-06-02 DIAGNOSIS — R208 Other disturbances of skin sensation: Secondary | ICD-10-CM

## 2023-06-02 DIAGNOSIS — R278 Other lack of coordination: Secondary | ICD-10-CM | POA: Diagnosis present

## 2023-06-02 NOTE — Therapy (Signed)
 OUTPATIENT PHYSICAL THERAPY NEURO TREATMENT   Patient Name: Todd Mcintyre MRN: [id number] DOB:[birth year], 28 y.o., male Today's Date: 06/02/2023   PCP: no REFERRING PROVIDER: Lanis Pupa, MD  END OF SESSION:  PT End of Session - 06/02/23 0857     Visit Number 6    Date for PT Re-Evaluation 07/28/23    Progress Note Due on Visit 10    PT Start Time 0855    PT Stop Time 0930    PT Time Calculation (min) 35 min    Activity Tolerance Patient tolerated treatment well    Behavior During Therapy [facility] for tasks assessed/performed                  Past Medical History:  Diagnosis Date   Concussion without loss of consciousness 02/06/2013   Neuromuscular disorder (HCC) 03/09/2023   Hx -facial numbness and left arm numbness   Syrinx of spinal cord (HCC) 03/09/2023   Past Surgical History:  Procedure Laterality Date   ADENOIDECTOMY     LAPAROSCOPIC APPENDECTOMY N/A 06/11/2014   Procedure: APPENDECTOMY LAPAROSCOPIC;  Surgeon: Camellia CHRISTELLA Blush, MD;  Location: [facility] OR;  Service: General;  Laterality: N/A;   SUBOCCIPITAL CRANIECTOMY CERVICAL LAMINECTOMY N/A 04/06/2023   Procedure: CHIARI DECOMPRESSION;  Surgeon: Lanis Pupa, MD;  Location: [facility] OR;  Service: Neurosurgery;  Laterality: N/A;   TONSILLECTOMY     Patient Active Problem List   Diagnosis Date Noted   Chiari malformation type I (HCC) 04/06/2023   Chiari I malformation (HCC) 04/06/2023   Visit for well man health check 10/22/2016   Screening examination for STD (sexually transmitted disease) 10/22/2016    ONSET DATE: 04/06/23  REFERRING DIAG: Chiari decompression  THERAPY DIAG:  Other lack of coordination  Muscle weakness (generalized)  Unsteadiness on feet  Imbalance  Difficulty in walking, not elsewhere classified  Rationale for Evaluation and Treatment: Rehabilitation  SUBJECTIVE:                                                                                                                                                                                              SUBJECTIVE STATEMENT: Pt reports mild headache, no fever, neurology appt tomorrow   Pt accompanied by: significant other  PERTINENT HISTORY: progression of double vision, coordination deficits, underwent emergent surgery  PAIN:  Are you having pain? No  PRECAUTIONS: None  RED FLAGS: None   WEIGHT BEARING RESTRICTIONS: No  FALLS: Has patient fallen in last 6 months? No  LIVING ENVIRONMENT: Lives with: lives with their partner, lives with their son, and lives with their daughter, 32 and 49 y.o children Lives  in: House/apartment Stairs: Yes: Internal: 11 steps; on right going up Has following equipment at home: None  PLOF: Independent, worked as Paediatric Nurse  PATIENT GOALS: return to I life  OBJECTIVE:  Note: Objective measures were completed at Evaluation unless otherwise noted.  COORDINATION: Delayed movements, decreased coordination L LE  POSTURE: some weight bearing B forefeet  LOWER EXTREMITY ROM:  wfl B LE's   LOWER EXTREMITY MMT:  MMT LE's wfl   GAIT: Gait pattern: decreased heel strike L, shortened stride length B, some circumduction L LE  FUNCTIONAL TESTS:  Berg Balance Scale: 46/56    TODAY'S TREATMENT:                                                                                                                              DATE: 06/02/23: Nustep L5 Ue's and LE's 6 min Supine with feet on pball  Pball flexion + PPT 2x10  Bridging over P ball 2x10  Ball squeeze (small blue 6)between feet 2x10  SAQ with 6 ball  squeeze between feet 2x10 In ll bars, 6  forward step ups with opposite knee drivers 10 x each In ll bars, lunges on compliant surface of BOSU 15 x each leg In ll bars, semitandem standing holding 2# medibar, lead foot on 6 step, for rotation with punches L and R, 10 x each Alt toe tapping to target on 6 step 15x  Heel toe walking , 2 x in ll  bars 05/25/23 Supine with feet on pball  Pball flexion + PPT 2x10  Bridging 2x10  Ball squeeze between feet 2x10  SAQ with ball squeeze between feet 2x10 Supine hip abd red TB around feet 2x10 Standing at counter (min A throughout for minor LOBs)  One foot forward on dynadisk balance with diagonal chops yellow weighted ball 2x10  One foot side step on dynadisk, static balance in side lunge 2x30  Step back/fwd rock 2x10 on L only  Amb with heel/toe and cues to keep hips forward x2 laps, min A for minor LOBs    05/24/23 NuStep L5 x65mins  Resisted gait 30# 4 way x4  STS on airex with OHP yellow ball 2x10 Step ups from airex to 6 minA x5 each leg On airex volleyball hits Side steps on to airex    05/21/23:  Nustep, Ue's and LE's level 4 for 4 min In ll bars for tandem gait on airex balance beam In ll bars for heel/toe rocks on airex, unable to perform consistently without UE support Tandem standing, 30 sec holds alternating lead foot Alt toe taps to 1/2 ball 1 min On rocker board, side to side rocking and for/ back rocking, noted some reflexive tremor/clonus with forward motion Leg press, 80#, B LE's, 30 reps Ankle pumps 80#, B LE's 25 reps  05/07/23 NuStep L5x68mins  [facility] taps minA  Side steps on airex minA  Tandem standing in bars 30s SLS in bars 10s Standing on airex head  turns  Standing on airex reaching for numbers on wall  Balance on BOSU, mini squats 2x10 STS on airex 2x10 Shoulder ext 5# x10, 10# x10   05/05/23:   Initiated neuromuscular re education activities, including: Parallel bars for rocker board side to side and for/back Parallel bars heel /toe rocks on [employer] tandem standing lead foot on airex , with horizontal head turns Alt toe taps to 4 step, tried to united stationers of 60 BPM   PATIENT EDUCATION: Education details: POC, goals Person educated: Patient Education method: Programmer, Multimedia, Demonstration, Tactile cues, and Verbal cues Education  comprehension: verbalized understanding, returned demonstration, and verbal cues required  HOME EXERCISE PROGRAM: Advised to try the standing with lead foot on step with head turns, for home  GOALS: Goals reviewed with patient? Yes  SHORT TERM GOALS: Target date: 2 weeks 05/19/23  Able to follow home program I Baseline: Goal status: INITIAL   LONG TERM GOALS: Target date: 07/28/23 12 weeks   Berg increase from 44/56 to 50/56 Baseline:  Goal status: INITIAL  2.  Able to unilaterally stand on each leg 30 sec which is normal value for balance Baseline:  Goal status: INITIAL  3.  Able to walk up/ down curbs, stairs, uneven terrain I and without  loss of balance Baseline: needs one hand support for these activities Goal status: INITIAL  ASSESSMENT:  CLINICAL IMPRESSION: Patient is a 28 y.o. male who was participated today with  physical therapy due to recent decline in function with chiari malformation and decompression surgery 9 weeks ago. Presents with coordination and balance deficits, also double vision influencing his balance. Wore shorts today, noted atrophy L calf compared to R.  Treatment session today focused on gross L side strengthening and proximal control, and coordination at end of session.  Abbreviated session today as pt a few mins late.  Continues to benefit from skilled PT, has ongoing headache and will follow with neurologist tomorrow. OBJECTIVE IMPAIRMENTS: Abnormal gait, decreased balance, decreased coordination, and difficulty walking.    PLAN:  PT FREQUENCY: 2x/week  PT DURATION: 12 weeks  PLANNED INTERVENTIONS: 97110-Therapeutic exercises, 97530- Therapeutic activity, [id number]- Neuromuscular re-education, 97535- Self Care, and 02859- Manual therapy  PLAN FOR NEXT SESSION: progress with standing balance, coordination activities and multitasking skills    Sheza Strickland L Kimiya Brunelle, PT, DPT, OCS 06/02/2023, 9:00 AM

## 2023-06-02 NOTE — Patient Instructions (Addendum)
   Copyright  VHI. All rights reserved.  Putty-in-Your-Hand    Slowly squeeze putty or a soft rubber ball while breathing normally. Repeat with other hand. Repeat __20__ times. Do _1___ sessions per day.  http://gt2.exer.us /884   Copyright  VHI. All rights reserved.  Finger / Thumb Activities: Extension    Roll putty into rope shape using all fingers held straight. Hitchhike with thumb up and out.  Copyright  VHI. All rights reserved.  Pinch: Palmar    Pinch putty with right thumb and each fingertip in turn. Repeat __10-20__ times. Do ___1_ sessions per day. Activity: Peel fruit such as lemons or oranges.* Peel stickers off surfaces.   Vision strategies 1. Look for the edge of objects (to the left and/or right) so that you make sure you are seeing all of an object 2. Turn your head when walking, scan from side to side, particularly in busy environments 3. Use an organized scanning pattern. It's usually easier to scan from top to bottom, and left to right (like you are reading) 4. Double check yourself 5. Use a line guide (like a blank piece of paper) or your finger when reading

## 2023-06-03 ENCOUNTER — Ambulatory Visit: Payer: Self-pay | Admitting: General Practice

## 2023-06-03 NOTE — Telephone Encounter (Signed)
 Chief Complaint: High blood pressure, headache Symptoms: HTN, headache Frequency: 04/06/23- post surgical Pertinent Negatives: Patient denies visual changes, fever Disposition: [] ED /[] Urgent Care (no appt availability in office) / [x] Appointment(In office/virtual)/ []  [facility]/ [] Home Care/ [] Refused Recommended Disposition /[] [facility]/ []  Follow-up with PCP Additional Notes: Patient calls stating that he had surgery for Chiari Malfunction on 04/06/23 and has had a intermittent headache due to fluid build up since then. Patient reports he was seen by surgeon today and while in office his blood pressure was 160/100. Patient reports that it has been consistently high during his visits with the surgeon and that it was recommended he establish with PCP for HTN management. Patient reports his current symptoms are being managed by surgeon. Per protocol, patient to be evaluated within 3 days. Patient scheduled on 06/07/23 at 1400. Care advice reviewed, patient verbalized understanding.    Copied from CRM [id number]. Topic: Clinical - Red Word Triage >> Jun 03, 2023  1:43 PM Alfonso ORN wrote: Red Word that prompted transfer to Nurse Triage: Blood pressure 150-100 ,pulse  a little high   went to followup appointment from having his brain surgery his blood pressure been running high since had surgery  , went today 06/03/23 for followup from having brain surgery . There is spinal fluid build up where he had his incision from a brain surgery in 11/24 but is not leaking  having headache/migrane  . Patient requesting to be establish care as a new patient at the [facility] at [place] Reason for Disposition  [1] MILD-MODERATE headache AND [2] present > 72 hours  Systolic BP  >= 160 OR Diastolic >= 100  Answer Assessment - Initial Assessment Questions 1. LOCATION: Where does it hurt?      Surgical site, base of skull 2. ONSET: When did the headache start? (Minutes, hours or  days)      3 weeks, post surgical, chiari malfunction 3. PATTERN: Does the pain come and go, or has it been constant since it started?     Comes and goes, but it occurs daily 4. SEVERITY: How bad is the pain? and What does it keep you from doing?  (e.g., Scale 1-10; mild, moderate, or severe)   - MILD (1-3): doesn't interfere with normal activities    - MODERATE (4-7): interferes with normal activities or awakens from sleep    - SEVERE (8-10): excruciating pain, unable to do any normal activities        4/10 5. RECURRENT SYMPTOM: Have you ever had headaches before? If Yes, ask: When was the last time? and What happened that time?      This is related to fluid build up at surgical site 6. CAUSE: What do you think is causing the headache?     Post surgical 7. MIGRAINE: Have you been diagnosed with migraine headaches? If Yes, ask: Is this headache similar?      Denies 8. HEAD INJURY: Has there been any recent injury to the head?      Denies injury 9. OTHER SYMPTOMS: Do you have any other symptoms? (fever, stiff neck, eye pain, sore throat, cold symptoms)     Stiff neck, eye pain- related to Chiari  Answer Assessment - Initial Assessment Questions 1. BLOOD PRESSURE: What is the blood pressure? Did you take at least two measurements 5 minutes apart?     160/100, does not take BP meds 2. ONSET: When did you take your blood pressure?  At the surgeons office around 1100. 3. HOW: How did you take your blood pressure? (e.g., automatic home BP monitor, visiting nurse)     nurse 4. HISTORY: Do you have a history of high blood pressure?     denies 5. MEDICINES: Are you taking any medicines for blood pressure? Have you missed any doses recently?     denies 6. OTHER SYMPTOMS: Do you have any symptoms? (e.g., blurred vision, chest pain, difficulty breathing, headache, weakness)     Headache, reports it is surgical related  Protocols used: Headache-A-AH,  Blood Pressure - High-A-AH

## 2023-06-07 ENCOUNTER — Ambulatory Visit (INDEPENDENT_AMBULATORY_CARE_PROVIDER_SITE_OTHER): Payer: Medicaid Other | Admitting: Internal Medicine

## 2023-06-07 ENCOUNTER — Encounter: Payer: Self-pay | Admitting: Internal Medicine

## 2023-06-07 ENCOUNTER — Ambulatory Visit: Payer: Medicaid Other | Admitting: Occupational Therapy

## 2023-06-07 ENCOUNTER — Ambulatory Visit: Payer: Medicaid Other

## 2023-06-07 ENCOUNTER — Encounter: Payer: Self-pay | Admitting: Occupational Therapy

## 2023-06-07 VITALS — BP 128/80 | HR 90 | Temp 97.8°F | Ht 72.0 in | Wt 240.0 lb

## 2023-06-07 DIAGNOSIS — R739 Hyperglycemia, unspecified: Secondary | ICD-10-CM | POA: Diagnosis not present

## 2023-06-07 DIAGNOSIS — G950 Syringomyelia and syringobulbia: Secondary | ICD-10-CM | POA: Insufficient documentation

## 2023-06-07 DIAGNOSIS — R41842 Visuospatial deficit: Secondary | ICD-10-CM

## 2023-06-07 DIAGNOSIS — H532 Diplopia: Secondary | ICD-10-CM

## 2023-06-07 DIAGNOSIS — G935 Compression of brain: Secondary | ICD-10-CM | POA: Diagnosis not present

## 2023-06-07 DIAGNOSIS — D72829 Elevated white blood cell count, unspecified: Secondary | ICD-10-CM

## 2023-06-07 DIAGNOSIS — R278 Other lack of coordination: Secondary | ICD-10-CM | POA: Diagnosis not present

## 2023-06-07 DIAGNOSIS — R2681 Unsteadiness on feet: Secondary | ICD-10-CM

## 2023-06-07 DIAGNOSIS — M6281 Muscle weakness (generalized): Secondary | ICD-10-CM

## 2023-06-07 DIAGNOSIS — R7989 Other specified abnormal findings of blood chemistry: Secondary | ICD-10-CM

## 2023-06-07 DIAGNOSIS — R208 Other disturbances of skin sensation: Secondary | ICD-10-CM

## 2023-06-07 DIAGNOSIS — Z720 Tobacco use: Secondary | ICD-10-CM

## 2023-06-07 DIAGNOSIS — R2689 Other abnormalities of gait and mobility: Secondary | ICD-10-CM

## 2023-06-07 LAB — LIPID PANEL
Cholesterol: 159 mg/dL (ref 0–200)
HDL: 38.4 mg/dL — ABNORMAL LOW (ref >39.00)
LDL Cholesterol: 108 mg/dL — ABNORMAL HIGH (ref 0–99)
NonHDL: 120.41
Total CHOL/HDL Ratio: 4
Triglycerides: 62 mg/dL (ref 0.0–149.0)
VLDL: 12.4 mg/dL (ref 0.0–40.0)

## 2023-06-07 LAB — CBC WITH DIFFERENTIAL/PLATELET
Basophils Absolute: 0 10*3/uL (ref 0.0–0.1)
Basophils Relative: 0.6 % (ref 0.0–3.0)
Eosinophils Absolute: 0.1 10*3/uL (ref 0.0–0.7)
Eosinophils Relative: 1.9 % (ref 0.0–5.0)
HCT: 40.5 % (ref 39.0–52.0)
Hemoglobin: 13.1 g/dL (ref 13.0–17.0)
Lymphocytes Relative: 22 % (ref 12.0–46.0)
Lymphs Abs: 1.6 10*3/uL (ref 0.7–4.0)
MCHC: 32.4 g/dL (ref 30.0–36.0)
MCV: 87.4 fL (ref 78.0–100.0)
Monocytes Absolute: 0.5 10*3/uL (ref 0.1–1.0)
Monocytes Relative: 7.6 % (ref 3.0–12.0)
Neutro Abs: 4.9 10*3/uL (ref 1.4–7.7)
Neutrophils Relative %: 67.9 % (ref 43.0–77.0)
Platelets: 216 10*3/uL (ref 150.0–400.0)
RBC: 4.63 Mil/uL (ref 4.22–5.81)
RDW: 12.9 % (ref 11.5–15.5)
WBC: 7.2 10*3/uL (ref 4.0–10.5)

## 2023-06-07 LAB — COMPREHENSIVE METABOLIC PANEL
ALT: 86 U/L — ABNORMAL HIGH (ref 0–53)
AST: 39 U/L — ABNORMAL HIGH (ref 0–37)
Albumin: 4.4 g/dL (ref 3.5–5.2)
Alkaline Phosphatase: 51 U/L (ref 39–117)
BUN: 14 mg/dL (ref 6–23)
CO2: 31 meq/L (ref 19–32)
Calcium: 9.8 mg/dL (ref 8.4–10.5)
Chloride: 100 meq/L (ref 96–112)
Creatinine, Ser: 0.79 mg/dL (ref 0.40–1.50)
GFR: 122.09 mL/min (ref >60.00)
Glucose, Bld: 88 mg/dL (ref 70–99)
Potassium: 4 meq/L (ref 3.5–5.1)
Sodium: 137 meq/L (ref 135–145)
Total Bilirubin: 0.6 mg/dL (ref 0.2–1.2)
Total Protein: 7.2 g/dL (ref 6.0–8.3)

## 2023-06-07 LAB — HEMOGLOBIN A1C: Hgb A1c MFr Bld: 4.6 % (ref 4.6–6.5)

## 2023-06-07 NOTE — Therapy (Signed)
 OUTPATIENT OCCUPATIONAL THERAPY NEURO Treatment  Patient Name: Todd Mcintyre MRN: [id number] DOB:1996/03/31, 28 y.o., male Today's Date: 06/07/2023  PCP: none REFERRING PROVIDER: Dr. Lanis  END OF SESSION:  OT End of Session - 06/07/23 1407     Visit Number 4    Number of Visits 18    Date for OT Re-Evaluation 07/28/23    Authorization Type [employer] MCD, no auth required    Authorization Time Period 12 weeks    Authorization - Visit Number 2    OT Start Time [phone]    OT Stop Time 0930    OT Time Calculation (min) 38 min               Past Medical History:  Diagnosis Date   Concussion without loss of consciousness 02/06/2013   Neuromuscular disorder (HCC) 03/09/2023   Hx -facial numbness and left arm numbness   Syrinx of spinal cord (HCC) 03/09/2023   Past Surgical History:  Procedure Laterality Date   ADENOIDECTOMY     LAPAROSCOPIC APPENDECTOMY N/A 06/11/2014   Procedure: APPENDECTOMY LAPAROSCOPIC;  Surgeon: Camellia CHRISTELLA Blush, MD;  Location: [facility] OR;  Service: General;  Laterality: N/A;   SUBOCCIPITAL CRANIECTOMY CERVICAL LAMINECTOMY N/A 04/06/2023   Procedure: CHIARI DECOMPRESSION;  Surgeon: Lanis Pupa, MD;  Location: [facility] OR;  Service: Neurosurgery;  Laterality: N/A;   TONSILLECTOMY     Patient Active Problem List   Diagnosis Date Noted   Syringomyelia (HCC) 06/07/2023   Chiari malformation type I (HCC) 04/06/2023   Chiari I malformation (HCC) 04/06/2023   Visit for well man health check 10/22/2016   Screening examination for STD (sexually transmitted disease) 10/22/2016    ONSET DATE: 04/06/23  REFERRING DIAG: G93.5 Chiari malfrormation type I  THERAPY DIAG:  Other lack of coordination  Muscle weakness (generalized)  Visuospatial deficit  Other disturbances of skin sensation  Other abnormalities of gait and mobility  Rationale for Evaluation and Treatment: Rehabilitation  SUBJECTIVE:   SUBJECTIVE STATEMENT: Pt reports he is having a  down day and he requests to skip PT Pt accompanied by: significant other Recardo  PERTINENT HISTORY: Pt is 28 yo male who presents on 04/06/23 for Arnold-Chiari malformation repair (suboccipital craniectomy with expansile duraplasty with C1 laminectomy). PMH: appendectomy, tonsillectomy, several ED visits for facial numbness and L side numbness PTA pt lives independently with his girlfriend and 2 small children. 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 Yes: NPRS scale: 4/10 Pain location: LUE Pain description: numbness Aggravating factors: sleeping on L side Relieving factors: positioning  FALLS: Has patient fallen in last 6 months? No  LIVING ENVIRONMENT: Lives with: lives with an adult companion Lives in: House/apartment Stairs: Yes: Internal:  Has following equipment at home: None  PLOF: Independent  PATIENT GOALS: improve independence, improve vision and strength  OBJECTIVE:  Note: Objective measures were completed at Evaluation unless otherwise noted.  HAND DOMINANCE: Right  ADLs: Overall ADLs: mod I with all basic ADLs Transfers/ambulation related to ADLs:  UB Dressing: mod I LB Dressing: mod I Toileting: mod I Bathing: mod I Tub Shower transfers: mod I walkin shower  IADLs:  Light housekeeping: has done light tasks but not back to prior level Meal Prep: has cooked but  pt reports  left hand was on hot pan, cautioned against cooking due to risk for injury Community mobility: mod I Medication management: Pt handles    MOBILITY STATUS:  Independent    UPPER EXTREMITY ROM:  RUE WFLS  Active ROM Right eval Left eval  Shoulder flexion  120  Shoulder abduction    Shoulder adduction    Shoulder extension    Shoulder internal rotation    Shoulder external rotation    Elbow flexion  WFLs  Elbow extension  -10  Wrist flexion    Wrist extension    Wrist ulnar  deviation    Wrist radial deviation    Wrist pronation    Wrist supination    (Blank rows = not tested)  UPPER EXTREMITY MMT:     MMT Right eval Left eval  Shoulder flexion 5/5 3+/5  Shoulder abduction    Shoulder adduction    Shoulder extension    Shoulder internal rotation    Shoulder external rotation    Middle trapezius    Lower trapezius    Elbow flexion 5/5 4-/5  Elbow extension 5/5 4-/5  Wrist flexion    Wrist extension    Wrist ulnar deviation    Wrist radial deviation    Wrist pronation    Wrist supination    (Blank rows = not tested)  HAND FUNCTION: Grip strength: Right: 107 lbs; Left: 53 lbs  COORDINATION: 9 Hole Peg test: Right: 33.30 sec; Left: 49.02 sec   SENSATION: Light touch: Impaired  for LUE, pt also reports impaired hot/ cold sensation   COGNITION: Overall cognitive status: Within functional limits for tasks assessed  VISION: Subjective report: Pt reports double vision Baseline vision: No visual deficits   VISION ASSESSMENT: Eye alignment: Impaired:  Gaze preference/alignment: WDL Tracking/Visual pursuits: Right eye does not track laterally Saccades: additional eye shifts occurred during testing Convergence: Impaired:  Diplopia assessment: objects splits side to side and present in primary gaze  Patient has difficulty with following activities due to following visual impairments: reading, pt arrived wearing eye patch on R eye. He reports that he has claer glasses with tape to help with diplopia at home.  PERCEPTION: Impaired: depth perception    OBSERVATIONS: Pleasant 28 y.o who was cooperative throughout eval accompanied by his GF   TODAY'S TREATMENT:                                                                                                                              DATE:06/07/23-Pt rpeorts he is having a down day and he requests to skip PT.  Pt reports he is not having ideas of suicide. Therapist recommends that pt sees a  psychologist to address his mood and coping and and possibly neurology for medical management of his issues. Note sent to pt's new PCP requesting these referrals. Number cancellation task with partial occulsion, 100% accuracy. BP 134/90 UBE x 6 mins level 3 for conditioning. Placing grooved pegs into pegboard with LUE for increased fine motor coordination, min difficulty/ v.c  06/02/23- Therapist reviewed attendance policy as pt has had several no shows. Pt reports terrible headaches prevented  him from attending. He was remineded to call to cx. Education regarding visual compensations and green putty HEP performed, see pt instructions. Copying small peg design withleft eye patched, 1 error and pt was able to self correct. UBE x 5 mins level 3 for condtioning.    05/06/23- see pt instructions/ education  05/05/23- eval   PATIENT EDUCATION: Education details:green putty HEP, visual compensations for safety Person educated: Patient  Education method: Explanation, demonstration, v.c, handout Education comprehension: verbalized understanding, returned demonstration, v.c  HOME EXERCISE PROGRAM: diplopia HEP,coordiantion HEP   GOALS: Goals reviewed with patient? Yes  SHORT TERM GOALS: Target date: 06/03/22  I with inital HEP. Baseline:dependent Goal status:  met for putty 06/07/23  2.   Pt will verbalize understanding of compensatory strategies for vision Baseline: dependent Goal status:  ongoing, issued 06/02/23  3.  Pt will demonstrate improved LUE fine motor coordiantion as evidenced by decreasing 9 hole peg test score to 45 secs or less Baseline: see above Goal status: INITIAL  4.  Pt will increase LUE grip strength to 60 lbs or better for improved LUE functional use. Baseline:see above  Goal status:  met, 85 lbs 06/02/23  5.  Pt will verbalize understanding of sensory precautions for LUE Baseline: dependent Goal status: ongoing    6 Pt will demonstrate ability to read a  short paragraph without complaints of diplopia. Baseline: unable  Goal status:  ongoing  LONG TERM GOALS: Target date: 07/28/22  I with updated HEP Baseline: dependent Goal status: INITIAL  2.  Pt will demonstrate ability to read a  a page of newsprint without complaints of diplopia. Baseline: unable due to diplopia Goal status: INITIAL  3.  Pt will demonstrate improved LUE fine motor coordiantion as evidenced by decreasing 9 hole peg test score to 40 secs or less Baseline:see above  Goal status: INITIAL  4.  Pt will increase LUE grip strength to 68 lbs or greater for increased functional use. Baseline: see above Goal status: INITIAL  5.  Pt will resume performance of cooking activities mod I with good safety awareness Baseline: currently pt has touched hot object with LUE while cooking due to decreased sensation Goal status: INITIAL  6.  Pt will resume performance of mod complex home management mod I Baseline: has not resumed prior level due to deficits. Goal status: INITIAL 7 Pt will perfrom simulated work activities mod I  Baseline:dependent  Goal status:inital  ASSESSMENT:  CLINICAL IMPRESSION:   Pt is progressing towards goals.He reports down mood today and frustation with neurosurgery management of his medical condition. Therapist messaged pt's PCP regarding potential referral to psychology and neurology.PERFORMANCE DEFICITS: in functional skills including ADLs, IADLs, coordination, dexterity, sensation, ROM, strength, Fine motor control, Gross motor control, mobility, balance, endurance, decreased knowledge of precautions, vision, and UE functional use,  and psychosocial skills including coping strategies, environmental adaptation, habits, interpersonal interactions, and routines and behaviors.   IMPAIRMENTS: are limiting patient from ADLs, IADLs, rest and sleep, work, play, leisure, and social participation.   CO-MORBIDITIES: may have co-morbidities  that affects  occupational performance. Patient will benefit from skilled OT to address above impairments and improve overall function.  MODIFICATION OR ASSISTANCE TO COMPLETE EVALUATION: No modification of tasks or assist necessary to complete an evaluation.  OT OCCUPATIONAL PROFILE AND HISTORY: Detailed assessment: Review of records and additional review of physical, cognitive, psychosocial history related to current functional performance.  CLINICAL DECISION MAKING: LOW - limited treatment options, no task modification necessary  REHAB  POTENTIAL: Good  EVALUATION COMPLEXITY: Low    PLAN:  OT FREQUENCY:  17 visits plus eval  OT DURATION: 12 weeks  PLANNED INTERVENTIONS: 97168 OT Re-evaluation, 97535 self care/ADL training, 02889 therapeutic exercise, 97530 therapeutic activity, 97112 neuromuscular re-education, 97140 manual therapy, 97113 aquatic therapy, 97035 ultrasound, 97018 paraffin, 02989 moist heat, 97010 cryotherapy, 97034 contrast bath, passive range of motion, balance training, functional mobility training, visual/perceptual remediation/compensation, energy conservation, coping strategies training, patient/family education, and DME and/or AE instructions  RECOMMENDED OTHER SERVICES: PT  CONSULTED AND AGREED WITH PLAN OF CARE: Patient and family member/caregiver- ELINORE Bread  PLAN FOR NEXT SESSION: theraband HEP   Warrick Llera, OT 06/07/2023, 2:10 PM

## 2023-06-07 NOTE — Patient Instructions (Signed)
 We will check the labs today and get you in with the neurologist and the neurosurgeon for a second opinion.

## 2023-06-07 NOTE — Progress Notes (Signed)
   Subjective:   Patient ID: Todd Mcintyre, male    DOB: [birth year], 28 y.o.   MRN: [id number]  HPI The patient is a new 28 YO man coming in for issues since a 28  malformation surgery including headaches and BP issues. Still having sensation issues and is in PT and OT. Balance and eye issues which are stable but unchanged since after surgery. Is not sure PT/OT is helping much. Sudden onset of symptoms within about 6 months of now and then surgery 04/06/23.   PMH, [facility], social history reviewed and updated  Review of Systems  Constitutional:  Positive for activity change and fatigue.  HENT: Negative.    Eyes:  Positive for visual disturbance.       Diplopia  Respiratory:  Negative for cough, chest tightness and shortness of breath.   Cardiovascular:  Negative for chest pain, palpitations and leg swelling.  Gastrointestinal:  Negative for abdominal distention, abdominal pain, constipation, diarrhea, nausea and vomiting.  Musculoskeletal: Negative.   Skin: Negative.   Neurological:  Positive for weakness and numbness.  Psychiatric/Behavioral:  Positive for dysphoric mood.     Objective:  Physical Exam Constitutional:      Appearance: He is well-developed.  HENT:     Head: Normocephalic and atraumatic.     Comments: Surgical site at the posterior skull without signs of infection, some fullness in the area mildly tender to palpation Cardiovascular:     Rate and Rhythm: Normal rate and regular rhythm.  Pulmonary:     Effort: Pulmonary effort is normal. No respiratory distress.     Breath sounds: Normal breath sounds. No wheezing or rales.  Abdominal:     General: Bowel sounds are normal. There is no distension.     Palpations: Abdomen is soft.     Tenderness: There is no abdominal tenderness. There is no rebound.  Musculoskeletal:     Cervical back: Normal range of motion.  Skin:    General: Skin is warm and dry.  Neurological:     Mental Status: He is alert and oriented  to person, place, and time.     Sensory: Sensory deficit present.     Motor: Weakness present.     Coordination: Coordination normal.     Gait: Gait abnormal.     Comments: Wide based gait, temp sensation difference left arm and leg with change in sensation left sided and facial, diplopia right eye with patch on part of the glasses     Vitals:   06/07/23 1403  BP: 128/80  Pulse: 90  Temp: 97.8 F (36.6 C)  TempSrc: Temporal  SpO2: 97%  Weight: 240 lb (108.9 kg)  Height: 6' (1.829 m)    Assessment & Plan:

## 2023-06-09 DIAGNOSIS — Z720 Tobacco use: Secondary | ICD-10-CM | POA: Insufficient documentation

## 2023-06-09 DIAGNOSIS — R739 Hyperglycemia, unspecified: Secondary | ICD-10-CM | POA: Insufficient documentation

## 2023-06-09 DIAGNOSIS — D72829 Elevated white blood cell count, unspecified: Secondary | ICD-10-CM | POA: Insufficient documentation

## 2023-06-09 DIAGNOSIS — R7989 Other specified abnormal findings of blood chemistry: Secondary | ICD-10-CM | POA: Insufficient documentation

## 2023-06-09 DIAGNOSIS — H532 Diplopia: Secondary | ICD-10-CM | POA: Insufficient documentation

## 2023-06-09 NOTE — Assessment & Plan Note (Signed)
 He was taking a lot of otc medications for pain. They were not informed about the high LFTs at ER visit recently. Checking CMP and if persistently elevated will need assessment of this. He is no longer taking as much otc meds. Using butalbital apap BID at this time.

## 2023-06-09 NOTE — Assessment & Plan Note (Signed)
 Counseled unable to make quit attempt currently although he has cut back significantly.

## 2023-06-09 NOTE — Assessment & Plan Note (Signed)
 Checking CBC with diff. They are concerned about the fluid collection around the scar area. I do not detect infection on exam or purulent drainage or fluctuance around the scar.

## 2023-06-09 NOTE — Assessment & Plan Note (Signed)
 Appeared at onset of symptoms from chiari and has not improved. I am unsure given lack of clear history if this is expected to improve or not.

## 2023-06-09 NOTE — Assessment & Plan Note (Signed)
 They have a lot of questions about the surgery and the recovery process which I am unable to answer. I do not have any neurosurgery notes to refer to so am unable to assess what surgery was done versus disucssed with them prior. They wish second opinion neurosurgeon as well as opinion of neurologist. They are unsure what manifestations will be improving versus permanent and I am unable to adequately answer.

## 2023-06-09 NOTE — Assessment & Plan Note (Signed)
 Checking HGA1c for screening due to elevated sugar at ER.

## 2023-06-14 ENCOUNTER — Ambulatory Visit: Payer: Medicaid Other | Admitting: Occupational Therapy

## 2023-06-14 ENCOUNTER — Ambulatory Visit: Payer: Medicaid Other

## 2023-06-21 ENCOUNTER — Ambulatory Visit: Payer: Medicaid Other | Admitting: Occupational Therapy

## 2023-06-21 ENCOUNTER — Ambulatory Visit: Payer: Medicaid Other | Admitting: Physical Therapy

## 2023-06-28 ENCOUNTER — Ambulatory Visit: Payer: Medicaid Other | Admitting: Occupational Therapy

## 2023-06-28 ENCOUNTER — Ambulatory Visit: Payer: Medicaid Other

## 2023-07-05 ENCOUNTER — Ambulatory Visit: Payer: Medicaid Other

## 2023-07-05 ENCOUNTER — Ambulatory Visit: Payer: Medicaid Other | Admitting: Occupational Therapy

## 2023-08-19 ENCOUNTER — Encounter: Payer: Self-pay | Admitting: Neurology

## 2023-08-19 ENCOUNTER — Ambulatory Visit: Payer: Medicaid Other | Admitting: Neurology

## 2023-08-19 VITALS — BP 135/87 | Resp 16 | Ht 72.0 in | Wt 267.5 lb

## 2023-08-19 DIAGNOSIS — H532 Diplopia: Secondary | ICD-10-CM | POA: Diagnosis not present

## 2023-08-19 DIAGNOSIS — S0441XD Injury of abducent nerve, right side, subsequent encounter: Secondary | ICD-10-CM

## 2023-08-19 DIAGNOSIS — G935 Compression of brain: Secondary | ICD-10-CM

## 2023-08-19 NOTE — Progress Notes (Unsigned)
 [facility]  PATIENT: Todd Mcintyre DOB: Apr 25, 1996  REQUESTING CLINICIAN: Myrlene Broker, * HISTORY FROM: Patient/Girlfriend REASON FOR VISIT: Chiari I malformation/Double vision    HISTORICAL  CHIEF COMPLAINT:  Chief Complaint  Patient presents with   New Patient (Initial Visit)    Rm12, gf present,  NP internal referral for Chiari I malformation: has double vision, left sided whole body numbness. Went to [facility] and here for second opinion. Gf concerned as to why cyst wasn't checked during recent surgery.     HISTORY OF PRESENT ILLNESS:  This is a 28 year old gentleman with history of Chiari I malformation and syringomyelia status post decompression surgery who is presenting with persistent double vision.  Patient tells me that he was in is normal state of health, never had any medical condition until September 2024 when he experienced severe double vision.  He presented to the hospital, had a brain MRI brain that rule out stroke and show Chiari I malformation.  He did follow-up with ophthalmology, but in October presented again to the hospital due to numbness and weakness worse on the left side.  He is MRI cervical spine cervical showed extensive syringomyelia extending from the brainstem to the cervical and thoracic spine measuring 8 x 16 mm in axial plane.  He was seen by neurosurgery and scheduled for decompression surgery in November.  Status post decompression surgery, patient completed physical therapy, reports improvement in left-sided weakness but the double vision is still present.  This is a binocular double vision, when looking at image with 1 eye closed, it is normal.   OTHER MEDICAL CONDITIONS: Chiari I malformation with syringomyelia,    REVIEW OF SYSTEMS: Full 14 system review of systems performed and negative with exception of: As noted in the HPI   ALLERGIES: No Known Allergies  HOME MEDICATIONS: Outpatient  Medications Prior to Visit  Medication Sig Dispense Refill   Butalbital-APAP-Caff-Cod 50-300-40-30 MG CAPS Take 1 capsule by mouth every 6 (six) hours as needed.     No facility-administered medications prior to visit.    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/09/2023    PAST SURGICAL HISTORY: Past Surgical History:  Procedure Laterality Date   ADENOIDECTOMY     LAPAROSCOPIC APPENDECTOMY N/A 06/11/2014   Procedure: APPENDECTOMY LAPAROSCOPIC;  Surgeon: Atilano Ina, MD;  Location: [facility] OR;  Service: General;  Laterality: N/A;   SUBOCCIPITAL CRANIECTOMY CERVICAL LAMINECTOMY N/A 04/06/2023   Procedure: CHIARI DECOMPRESSION;  Surgeon: Lisbeth Renshaw, MD;  Location: [facility] OR;  Service: Neurosurgery;  Laterality: N/A;   TONSILLECTOMY      FAMILY HISTORY: Family History  Problem Relation Age of Onset   Diabetes Maternal Grandmother     SOCIAL HISTORY: Social History   Socioeconomic History   Marital status: Single    Spouse name: Not on file   Number of children: Not on file   Years of education: Not on file   Highest education level: Not on file  Occupational History   Not on file  Tobacco Use   Smoking status: Light Smoker    Types: Cigars   Smokeless tobacco: Never  Vaping Use   Vaping status: Never Used  Substance and Sexual Activity   Alcohol use: Yes    Comment: occasional   Drug use: Yes  Types: Marijuana    Comment: pt states uses it "twice a  day everyday" , patient instucted to withhold 24-48 hours prior to anesthesia   Sexual activity: Yes    Birth control/protection: Condom  Other Topics Concern   Not on file  Social History Narrative   Working at [employer] home improvement   Has a long term girlfriend   Social Drivers of Corporate investment banker Strain:  Not on file  Food Insecurity: No Food Insecurity (04/06/2023)   Hunger Vital Sign    Worried About Running Out of Food in the Last Year: Never true    Ran Out of Food in the Last Year: Never true  Transportation Needs: No Transportation Needs (04/06/2023)   PRAPARE - Administrator, Civil Service (Medical): No    Lack of Transportation (Non-Medical): No  Physical Activity: Not on file  Stress: Not on file  Social Connections: Not on file  Intimate Partner Violence: Not At Risk (04/06/2023)   Humiliation, Afraid, Rape, and Kick questionnaire    Fear of Current or Ex-Partner: No    Emotionally Abused: No    Physically Abused: No    Sexually Abused: No    PHYSICAL EXAM  GENERAL EXAM/CONSTITUTIONAL: Vitals:  Vitals:   08/19/23 1045  BP: 135/87  Resp: 16  Weight: 267 lb 8 oz (121.3 kg)  Height: 6' (1.829 m)   Body mass index is 36.28 kg/m. Wt Readings from Last 3 Encounters:  08/19/23 267 lb 8 oz (121.3 kg)  06/07/23 240 lb (108.9 kg)  05/14/23 229 lb 15 oz (104.3 kg)   Patient is in no distress; well developed, nourished and groomed; neck is supple  MUSCULOSKELETAL: Gait, strength, tone, movements noted in Neurologic exam below  NEUROLOGIC: MENTAL STATUS:      No data to display         awake, alert, oriented to person, place and time recent and remote memory intact normal attention and concentration language fluent, comprehension intact, naming intact fund of knowledge appropriate  CRANIAL NERVE:  2nd, 3rd, 4th, 6th - Visual fields full to confrontation, he has limited abduction of the right eye, consistent with abducens palsy. He also has limited vertical gaze. There is end gaze nystagmus 5th - facial sensation symmetric 7th - facial strength symmetric 8th - hearing intact 9th - palate elevates symmetrically, uvula midline 11th - shoulder shrug symmetric 12th - tongue protrusion midline  MOTOR:  normal bulk and tone, Decrease strength 4/5  LUE and LLE when compared to the right  SENSORY:  normal and symmetric to light touch  COORDINATION:  finger-nose-finger, fine finger movements normal  REFLEXES:  deep tendon reflexes present and symmetric  GAIT/STATION:  normal     DIAGNOSTIC DATA (LABS, IMAGING, TESTING) - I reviewed patient records, labs, notes, testing and imaging myself where available.  Lab Results  Component Value Date   WBC 7.2 06/07/2023   HGB 13.1 06/07/2023   HCT 40.5 06/07/2023   MCV 87.4 06/07/2023   PLT 216.0 06/07/2023      Component Value Date/Time   NA 137 06/07/2023 1447   K 4.0 06/07/2023 1447   CL 100 06/07/2023 1447   CO2 31 06/07/2023 1447   GLUCOSE 88 06/07/2023 1447   BUN 14 06/07/2023 1447   CREATININE 0.79 06/07/2023 1447   CALCIUM 9.8 06/07/2023 1447   PROT 7.2 06/07/2023 1447   ALBUMIN 4.4 06/07/2023 1447   AST 39 (H) 06/07/2023 1447   ALT 86 (  H) 06/07/2023 1447   ALKPHOS 51 06/07/2023 1447   BILITOT 0.6 06/07/2023 1447   GFRNONAA >60 05/15/2023 0029   GFRAA >90 06/10/2014 1922   Lab Results  Component Value Date   CHOL 159 06/07/2023   HDL 38.40 (L) 06/07/2023   LDLCALC 108 (H) 06/07/2023   TRIG 62.0 06/07/2023   CHOLHDL 4 06/07/2023   Lab Results  Component Value Date   HGBA1C 4.6 06/07/2023   No results found for: "VITAMINB12" Lab Results  Component Value Date   TSH 1.59 12/14/2013    MRI Brain 03/10/2023 1. No acute intracranial process. No evidence of acute or subacute infarct. 2. Redemonstrated Chiari malformation with 9 mm of cerebellar tonsillar extension past the foramen magnum, with associated cervical spinal cord syrinx.  MRI Cervical spine 03/10/2023 1. Extensive spinal cord syrinx extending from the brainstem through the cervical and imaged thoracic spinal cord, measuring up to 8 x 16 mm in the axial plane. 2. Extension of the cerebellar tonsils through the foramen magnum is better evaluated on the same day MRI head. 3. C3-C4  mild-to-moderate left neural foraminal narrowing. No spinal canal stenosis.  ASSESSMENT AND PLAN  28 y.o. year old male with chiari I malformation status post decompression who is presenting with persistent double vision and left sided weakness. His weakness improved after physical therapy but it is still present. Advise patient to continue with exercise at home.  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 We will recommend ophthalmology referral for prism glasses. Advised him to continue followup with PCP and return as needed.    1. Chiari malformation type I (HCC)   2. Injury of right abducent nerve, subsequent encounter   3. Double vision      Patient Instructions  Referral to ophthalmology for possible prism glasses  Continue with physical therapy exercise  Continue to follow up with PCP  Return if worse   Orders Placed This Encounter  Procedures   Ambulatory referral to Ophthalmology    No orders of the defined types were placed in this encounter.   Return if symptoms worsen or fail to improve.   Windell Norfolk, MD 08/20/2023, 8:23 AM  [facility] [address], [address] [place], [place] [address] [phone]

## 2023-08-20 NOTE — Patient Instructions (Signed)
 Referral to ophthalmology for possible prism glasses  Continue with physical therapy exercise  Continue to follow up with PCP  Return if worse

## 2023-08-23 ENCOUNTER — Telehealth: Payer: Self-pay | Admitting: Neurology

## 2023-08-23 NOTE — Telephone Encounter (Signed)
 Referral for ophthalmology fax to [facility]. Phone: [phone], Fax: [phone]
